# Patient Record
Sex: Female | Born: 1960 | Race: Black or African American | Hispanic: No | Marital: Married | Smoking: Former smoker
Health system: Southern US, Community
[De-identification: ages and names within clinical notes are randomized; demographics above are authoritative.]

## PROBLEM LIST (undated history)

## (undated) DIAGNOSIS — R51 Headache: Secondary | ICD-10-CM

## (undated) DIAGNOSIS — K5790 Diverticulosis of intestine, part unspecified, without perforation or abscess without bleeding: Secondary | ICD-10-CM

## (undated) DIAGNOSIS — K589 Irritable bowel syndrome without diarrhea: Secondary | ICD-10-CM

## (undated) DIAGNOSIS — E039 Hypothyroidism, unspecified: Secondary | ICD-10-CM

## (undated) DIAGNOSIS — R519 Headache, unspecified: Secondary | ICD-10-CM

## (undated) HISTORY — PX: THYROIDECTOMY: SHX17

## (undated) HISTORY — DX: Diverticulosis of intestine, part unspecified, without perforation or abscess without bleeding: K57.90

## (undated) HISTORY — PX: APPENDECTOMY: SHX54

## (undated) HISTORY — DX: Irritable bowel syndrome without diarrhea: K58.9

---

## 1989-01-29 HISTORY — PX: TUBAL LIGATION: SHX77

## 2003-09-15 ENCOUNTER — Other Ambulatory Visit: Admission: RE | Admit: 2003-09-15 | Discharge: 2003-09-15 | Payer: Self-pay | Admitting: Obstetrics and Gynecology

## 2003-12-20 ENCOUNTER — Encounter (INDEPENDENT_AMBULATORY_CARE_PROVIDER_SITE_OTHER): Payer: Self-pay | Admitting: Specialist

## 2003-12-21 ENCOUNTER — Inpatient Hospital Stay (HOSPITAL_COMMUNITY): Admission: RE | Admit: 2003-12-21 | Discharge: 2003-12-22 | Payer: Self-pay | Admitting: Obstetrics & Gynecology

## 2004-01-30 HISTORY — PX: ABDOMINAL HYSTERECTOMY: SUR658

## 2004-02-22 ENCOUNTER — Encounter (INDEPENDENT_AMBULATORY_CARE_PROVIDER_SITE_OTHER): Payer: Self-pay | Admitting: Specialist

## 2004-02-22 ENCOUNTER — Ambulatory Visit (HOSPITAL_COMMUNITY): Admission: RE | Admit: 2004-02-22 | Discharge: 2004-02-22 | Payer: Self-pay | Admitting: Obstetrics & Gynecology

## 2006-07-30 ENCOUNTER — Ambulatory Visit: Payer: Self-pay | Admitting: Gastroenterology

## 2006-07-30 LAB — CONVERTED CEMR LAB
Basophils Relative: 0.4 % (ref 0.0–1.0)
CRP, High Sensitivity: 1 (ref 0.00–5.00)
Eosinophils Relative: 1.4 % (ref 0.0–5.0)
Lymphocytes Relative: 32.2 % (ref 12.0–46.0)
Monocytes Relative: 5.7 % (ref 3.0–11.0)
Platelets: 287 10*3/uL (ref 150–400)
RBC: 4.09 M/uL (ref 3.87–5.11)
RDW: 12.2 % (ref 11.5–14.6)
Sed Rate: 15 mm/hr (ref 0–25)
Tissue Transglutaminase Ab, IgA: 0.4 units (ref <7)
WBC: 8.4 10*3/uL (ref 4.5–10.5)

## 2006-09-10 ENCOUNTER — Ambulatory Visit: Payer: Self-pay | Admitting: Gastroenterology

## 2006-09-10 ENCOUNTER — Encounter: Payer: Self-pay | Admitting: Gastroenterology

## 2006-09-10 DIAGNOSIS — K5730 Diverticulosis of large intestine without perforation or abscess without bleeding: Secondary | ICD-10-CM | POA: Insufficient documentation

## 2006-10-14 ENCOUNTER — Ambulatory Visit: Payer: Self-pay | Admitting: Gastroenterology

## 2006-10-28 ENCOUNTER — Ambulatory Visit: Payer: Self-pay | Admitting: Gastroenterology

## 2006-11-14 ENCOUNTER — Ambulatory Visit: Payer: Self-pay | Admitting: Gastroenterology

## 2006-11-22 ENCOUNTER — Ambulatory Visit: Payer: Self-pay | Admitting: Gastroenterology

## 2006-12-06 ENCOUNTER — Ambulatory Visit: Payer: Self-pay | Admitting: Gastroenterology

## 2007-01-27 ENCOUNTER — Ambulatory Visit: Payer: Self-pay | Admitting: Gastroenterology

## 2007-04-21 DIAGNOSIS — K589 Irritable bowel syndrome without diarrhea: Secondary | ICD-10-CM | POA: Insufficient documentation

## 2007-04-21 DIAGNOSIS — R197 Diarrhea, unspecified: Secondary | ICD-10-CM

## 2008-03-09 ENCOUNTER — Encounter: Payer: Self-pay | Admitting: Gastroenterology

## 2008-03-10 ENCOUNTER — Ambulatory Visit: Payer: Self-pay | Admitting: Gastroenterology

## 2008-03-17 ENCOUNTER — Telehealth: Payer: Self-pay | Admitting: Gastroenterology

## 2008-05-19 ENCOUNTER — Encounter: Payer: Self-pay | Admitting: Gastroenterology

## 2008-06-17 ENCOUNTER — Ambulatory Visit: Payer: Self-pay | Admitting: Gastroenterology

## 2008-06-17 ENCOUNTER — Telehealth: Payer: Self-pay | Admitting: Gastroenterology

## 2008-06-17 DIAGNOSIS — K219 Gastro-esophageal reflux disease without esophagitis: Secondary | ICD-10-CM

## 2010-06-13 NOTE — Assessment & Plan Note (Signed)
[facility]                         GASTROENTEROLOGY OFFICE NOTE   HAVEN, [name]                      MRN:          [id number]  DATE:07/30/2006                            DOB:          September 13, 1960    REFERRING PHYSICIAN:  Freddy Finner, M.D.   PROBLEM:  Chronic diarrhea.   HISTORY:  Monica Aguilar is a pleasant, 50 year old, African-American female  generally in good health. 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 She is status post hysterectomy in 2006 and had a  bilateral tubal ligation in 1991. She relates that she has had chronic  problems with diarrhea over at least the past 8-9 years and had been  told in the past that she had irritable bowel. She did have a barium  enema 5 or 6 years ago through [facility]. She says that she  feels that her symptoms have gotten somewhat worse with time. She is  taking Imodium or other over-the-counter antidiarrheals on a daily basis  and says she does not feel that she can leave the house without the  antidiarrheals. These do help her symptoms but she still has loose  stools and urgency. If she does not take the medication, she has very  urgent stools even to the point of having accidents. She says she gets  extreme urgency post prandially and generally is having 5 or 6 bowel  movements per day. She has not noticed any melena or hematochezia, she  occasionally gets some mild cramping and discomfort though not on a  daily basis and has had increased problems with gas and bloating as  well. She has no nocturnal symptoms. Her appetite has been fine, her  weight has been stable. She definitely feels that she is lactose  intolerant and avoids lactose and also feels that her symptoms are  triggered by spicy foods.   FAMILY HISTORY:  Negative for inflammatory bowel disease, celiac  disease, etc.   CURRENT MEDICATIONS:  1. Synthroid 0.137 mcg daily.  2. Allegra 180 daily.  3.  Dicyclomine 10 mg q.i.d. p.r.n.  4. Calcium supplement daily.  5. Multivitamin daily.  6. B complex daily.   ALLERGIES:  No known drug allergies.   PAST MEDICAL HISTORY:  As outlined above.   SOCIAL HISTORY:  The patient is married. She is employed as an Chemical engineer. She has 3 children, lives with her husband.  She is a smoker, drinks alcohol socially.   FAMILY HISTORY:  Negative for colon cancer, polyps or inflammatory bowel  disease. Father and niece both with diabetes.   REVIEW OF SYSTEMS:  Positive for allergy sinus symptoms, also low back  pain. GI as outlined above. Review of systems otherwise completely  negative.   PHYSICAL EXAMINATION:  GENERAL:  Well-developed, healthy-appearing,  African-American female in no acute distress. Alert and oriented x3.  VITAL SIGNS:  Blood pressure 122/82, pulse is 88.  HEENT:  Nontraumatic, normocephalic. EOMI. PERRLA. Sclera anicteric.  CARDIOVASCULAR:  Regular rate and rhythm with S1 and S2. No murmurs,  rubs or gallops.  PULMONARY:  Clear to A&P.  ABDOMEN:  Soft and nontender. There is no palpable mass or  hepatosplenomegaly. Bowel sounds are active.  RECTAL:  Not done.  EXTREMITIES:  Without clubbing, cyanosis or edema.   LABORATORY DATA:  Pending.   IMPRESSION:  A 50 year old, African-American female with chronic  diarrhea and lactose intolerance. Symptoms may all be secondary to  irritable bowel syndrome, diarrhea predominant. Rule out celiac disease,  rule out underlying inflammatory bowel disease or microscopic colitis.   PLAN:  1. Check CBC with DIF, sed rate, C reactive protein and sprue panel.  2. The patient had tried Bentyl in the past and could not tolerate due      to sleepiness. Will try Robinul Forte 2 mg b.i.d. and told her to      continue the antidiarrheal as needed. Hopefully she will require      less with the Robinul.  3. Trial of Align probiotic supplement 1 p.o. daily x30 days.  4.  Schedule colonoscopy with random biopsies. If workup is negative      will consider trial of Xifaxan and/or enrollment in an irritable      bowel syndrome clinical trial.      Mike Gip, PA-C  Electronically Signed      Barbette Hair. Arlyce Dice, MD,FACG  Electronically Signed   AE/MedQ  DD: 07/30/2006  DT: 07/31/2006  Job #: [id number]   cc:   Freddy Finner, M.D.

## 2010-06-13 NOTE — Assessment & Plan Note (Signed)
[facility]                         GASTROENTEROLOGY OFFICE NOTE   Monica Aguilar, Monica Aguilar                      MRN:          [id number]  DATE:10/14/2006                            DOB:          10/21/1960    PROBLEM:  Diarrhea.   HISTORY:  [name] has returned for scheduled GI followup.  Colonoscopy was pertinent for mild diverticulosis.  On Probiotica her  symptoms have improved, though she continues to have frequent loose  stools, accompanied by urgency.   PHYSICAL EXAMINATION:  VITAL SIGNS: Pulse 68, blood pressure 118/72.  Weight 169.   IMPRESSION:  Irritable bowel syndrome - diarrhea predominant.   RECOMMENDATIONS:  [name] will consider enrollment in an IBS trial.  Failing that , I would consider adding anticholinergics, while  continuing her Probiotic's.     Barbette Hair. Arlyce Dice, MD,FACG  Electronically Signed    RDK/MedQ  DD: 10/14/2006  DT: 10/14/2006  Job #: [id number]   cc:   Freddy Finner, M.D.

## 2010-06-16 NOTE — Discharge Summary (Signed)
Monica Aguilar NO.:  [id number]   MEDICAL RECORD NO.:  [id number]          PATIENT TYPE:  INP   LOCATION:  9316                          [facility]ACILITY:  [facility]   PHYSICIAN:  Freddy Finner, M.D.   DATE OF BIRTH:  12-30-1960   DATE OF ADMISSION:  12/20/2003  DATE OF DISCHARGE:  12/22/2003                                 DISCHARGE SUMMARY   DISCHARGE DIAGNOSES:  1.  Uterine leiomyomata.  2.  Uterine adenomyosis.  3.  Extensive pelvic adhesions.   OPERATIVE PROCEDURE:  1.  Laparoscopically-assisted vaginal hysterectomy.  2.  Lysis of extensive pelvic adhesion.  3.  Right salpingo-oophorectomy.  4.  Surgical absence of left tube and ovary.   INTRAOPERATIVE COMPLICATIONS:  None.   POSTOPERATIVE COMPLICATIONS:  Irritable bowel-like-syndrome/ileus  postoperatively, resolved.   OTHER INTRAOPERATIVE COMPLICATIONS:  Low-grade temperature elevation,  associated with ileus.   OTHER COMPLICATIONS:  None.   DISPOSITION:  The patient was in satisfactory improved condition at the time  of her discharge on the 2nd postoperative day.  Her maximum postoperative  temperature elevation was 100.7 but on the morning of discharge, she was  afebrile.  Her marked abdominal cramping discomfort had been relieved by  Bentyl 10 mg q.6h.  She was having adequate bowel and bladder function at  the time of her discharge on the 2nd postoperative morning.  She was  discharged at that time for follow up in the office in 2 weeks. She is to  take a regular diet.  She was given Bentyl to be taken every 6 hours as  needed for abdominal discomfort.  She was given Vivelle 0.1 dot skin patch  for transdermal estrogen replacement.  She was given Percocet 5/325 for  postoperative pain.  She was given instructions to use Mylicon and/or Gas-X  for abdominal distention and discomfort.   Details of the present illness, past history, family history, review of  systems, and physical exam are recorded  in the admission note.  The  admission physical findings were remarkable for enlargement of the uterus.   Laboratory data during this admission includes admission CBC which was  normal.  Admission prothrombin time and PTT were normal.  Postoperative  hemoglobin was 11.2.  Admission urinalysis was normal.   HOSPITAL COURSE:  The patient was admitted on the morning of surgery.  The  above-described operative procedure was accomplished without significant  intraoperative complications.  Postoperatively, her only complaint was of  moderate to severe abdominal cramping-like sensations which she attributed  to previous symptoms consistent with irritable bowel syndrome.  She did have  mild temperature elevation but maximum temp of 100.7  which dissipated by the morning of discharge.  She responded well to  restricted diet and oral Bentyl.  By the morning of her discharge, she was  tolerating a regular diet.  Her condition was considered to be satisfactory,  and she was discharged home with disposition as noted above.     Monica Aguilar   WRN/MEDQ  D:  01/20/2004  T:  01/21/2004  Job:  [id number]

## 2010-06-16 NOTE — Op Note (Signed)
NAMESHEILLA, MARIS NO.:  [id number]   MEDICAL RECORD NO.:  [id number]          PATIENT TYPE:  OBV   LOCATION:  9399                          [facility]ACILITY:  [facility]   PHYSICIAN:  Freddy Finner, M.D.   DATE OF BIRTH:  14-Jul-1960   DATE OF PROCEDURE:  12/20/2003  DATE OF DISCHARGE:                                 OPERATIVE REPORT   PREOPERATIVE DIAGNOSES:  Fibroids, chronic pelvic pain, severe dysmenorrhea.   POSTOPERATIVE DIAGNOSES:  Fibroids, chronic pelvic pain, severe dysmenorrhea  with surgical absence of left fallopian tube and ovary.  Extensive omental  adhesions intra-abdominally.   OPERATIVE PROCEDURE:  Laparoscopically-assisted vaginal hysterectomy, lysis  of right adnexal adhesions, lysis of omental adhesion, right salpingo-  oophorectomy.   SURGEON:  Dr. Jennette Kettle.   ASSISTANT:  Dr. Rosalia Hammers.   ESTIMATED INTRAOPERATIVE BLOOD LOSS:  100 cc.   INTRAOPERATIVE COMPLICATIONS:  None.   Details of the present illness are recorded in the admission note.  The  patient was admitted on the morning of surgery.  She received a bolus of  antibiotic IV preoperatively.  She was placed in PISO's.  She was brought to  the operating room, there placed under adequate general endotracheal  anesthesia, placed in the dorsal lithotomy position using the [place] stirrup  system.  Betadine prep was carried out in the usual fashion using Betadine  scrub followed by Betadine solution.  The bladder was evacuated with the  [facility] catheter.  The Hulka tenaculum was attached to the cervix without  difficulty.  Sterile drapes were applied.  Two small incisions were made,  one at the umbilicus and one just above the symphysis.  Through the  umbilical incision, a 9 mm non bladed disposable trocar was introduced while  elevating the anterior abdominal wall manually.  The rest of the inspection  revealed adequate placement without evidence of injury on entry.  The trocar  actually penetrated  through the omentum which was adherent to the anterior  abdominal wall but there was no injury associated with this and no bleeding  associated with this.  Extensive adhesions were noted both in the upper  abdomen and in the lower abdomen.  Photographs were made as well as  photographs of other findings which are retained in the office record.  The  uterus itself was visualized.  It was normal in size.  The right ovary was  adherent to the right lateral pelvic side wall but was otherwise normal as  was the fallopian tube.  The left ovary was surgically absent.  The uterus  was enlarged.  There were dense adhesions on the anterior lower uterine  segment.  Using the Gyrus tripolar device, one band-like omental adhesion  was lysed in the midline below the umbilicus.  The adhesions holding the  bladder into the lower segment were sealed and divided to release the  bladder.  The right adnexa was then elevated with a grasping forceps and the  infundibulopelvic ligament.  The adhesions holding the ovary to the right  pelvic side wall and the upper broad ligament and round ligament were all  sealed and divided.  This dissection was carried down to the level just  above the uterine artery.  On the left side, the upper broad ligament and  remnants of the round and infundibulopelvic ligaments were sealed and  divided to the level near the uterine artery.  Attention was then turned  vaginally.  The posterior weighted vaginal retractor was placed.  Beavers  were used to retract the anterior and lateral vaginal walls.  The cervix was  grasped with a Jacob's tenaculum and the Hulka tenaculum and removed.  A  colpotomy incision was made while tenting the mucosa posterior to the cervix  with an Alice.  The cervix was circumscribed by the scalpel to release the  mucosa.  The bladder was advanced off the cervix.  The Haney style Gyrus  clamp was then used to seal and divide the uterosacral pedicles and  bladder  pelvis.  The bladder was further advanced off the cervix and anterior  peritoneum entered.  The cardinal ligament pedicles were taken, sealed and  divided with the Gyrus device.  The uterine arteries were taken, sealed and  divided with the Gyrus device.  The uterus was then delivered through the  vaginal introitus.  The angles of the vagina were anchored to the  uterosacral ligaments with mattress sutures of 0 Monocryl.  The uterosacrals  were plicated and the posterior peritoneum closed with an interrupted 0  Monocryl suture.  The cuff was closed vertically with figure-of-eight of 0  Monocryl.  The Foley catheter was placed.  Re-inspection laparoscopically  was then carried out using the Nazat irrigation system.  No active bleedings  were noted on any of the dissected surfaces.  Irrigating solution was  aspirated from the abdomen.  The instruments were removed.  The incisions  were anesthetized with 0.5% plain Marcaine.  The incisions were closed with  interrupted subcuticular sutures of 3-0 Dexon.  Steri-Strips were applied to  the lower incision.  The patient was awakened and taken to the recovery room  in good condition.     Hosie Spangle   WRN/MEDQ  D:  12/20/2003  T:  12/20/2003  Job:  [id number]

## 2010-06-16 NOTE — H&P (Signed)
Monica Aguilar, GINDLESPERGER NO.:  [id number]   MEDICAL RECORD NO.:  [id number]          PATIENT TYPE:  AMB   LOCATION:  SDC                           [facility]ACILITY:  [facility]   PHYSICIAN:  Freddy Finner, M.D.   DATE OF BIRTH:  July 04, 1960   DATE OF ADMISSION:  DATE OF DISCHARGE:                                HISTORY & PHYSICAL   ADMISSION DIAGNOSES:  1.  Uterine fibroids.  2.  Chronic pelvic pain.  3.  Severe dysmenorrhea.   The patient is a 50 year old white married female, gravida 3, para 3, who  has a long history of intermittent episodes of pelvic pain, and has been  known to have uterine fibroids for a number of years.  Her pain she  described as intermittent in the past, but over the last approximately 5-6  months she has had significantly more frequent episodes of pelvic pain, she  has severe dysmenorrhea, she has a pressure sensation of her bladder.  A  pelvic ultrasound was performed in the office which showed a 4.3 x 3.5  centimeter intramural fibroid which abuts on the endometrial stripe.  There  are also a 1.6 centimeter and 9 millimeter smaller leiomyomata versus simple  right ovarian cyst measuring 16 millimeters most consistent with a follicle.  Pelvic findings were otherwise normal.  The patient does have a past history  of having her left ovary and fallopian tube removed for reasons that are  unclear.  She has also previously had an appendectomy.  Because of the  persistence of her symptoms, she has requested definitive surgical  intervention.  She is now admitted for laparoscopically assisted vaginal  hysterectomy and possible unilateral salpingo-oophorectomy.   Her current review of systems is otherwise negative.  She has no  cardiopulmonary, GI, or GU complaints.   PAST MEDICAL HISTORY:  The patient has had a total thyroidectomy.  She takes  Synthroid on a regular basis.  [facility].  Previous  surgical procedures include the surgery as  noted above.  She has no other known surgical procedures except giving birth  to children vaginally x3.  She has never had a blood transfusion.  Her only  chronic medication is Synthroid and she takes pain medication.  She does not  use cigarettes.   FAMILY HISTORY:  Noncontributory.   PHYSICAL EXAMINATION:  HEENT:  Grossly within normal limits.  Thyroid gland  is not palpable.  Blood pressure in the office 140/82.  CHEST:  Clear to auscultation.  HEART:  Normal sinus rhythm without murmurs, rubs or gallops.  BREASTS:  Exam is normal.  No masses.  No skin changes.  No nipple  discharge.  ABDOMEN:  Soft, moderately obese, there is no appreciable organomegaly, no  palpable masses.  PELVIC:  External genitalia, vagina and cervix are normal.  Bimanual reveals  the uterus to be enlarged.  There are no palpable adnexal masses.  The  rectum is palpably normal and the rectovaginal exam confirms the above  findings.  EXTREMITIES:  Without cyanosis, clubbing or edema.  ASSESSMENT:  1.  Uterine enlargement secondary to fibroids.  2.  Severe dysmenorrhea.  3.  Chronic pelvic pressure pain.   PLAN:  Laparoscopically assisted vaginal hysterectomy, possible unilateral  salpingo-oophorectomy.     Hosie Spangle   WRN/MEDQ  D:  12/17/2003  T:  12/17/2003  Job:  [id number]

## 2010-06-16 NOTE — Op Note (Signed)
Monica Aguilar, CRIGER NO.:  [id number]   MEDICAL RECORD NO.:  [id number]          PATIENT TYPE:  AMB   LOCATION:  SDC                           [facility]   PHYSICIAN:  Freddy Finner, M.D.   DATE OF BIRTH:  [birth year]   DATE OF PROCEDURE:  02/22/2004  DATE OF DISCHARGE:                                 OPERATIVE REPORT   PREOPERATIVE DIAGNOSIS:  Chronic persistent right pelvic and lower abdominal  pain, cystic right adnexal collection of fluid by ultrasound, known  significant pelvic adhesions, recent laparoscopically-assisted vaginal  hysterectomy, right salpingo-oophorectomy in November 2005, with  intraoperative diagnosis of pelvic adhesions, histologic diagnosis of  uterine adenomyosis and uterine leiomyomata.   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   OPERATIVE PROCEDURE:  Laparoscopy, lysis of pelvic adhesions, resection of  scarred or ovarian remnant from right pelvic sidewall, resection of left  ovarian remnant from ileum, lysis of pelvic adhesions, with the release of  ileum from pelvic peritoneum.   SURGEON:  Freddy Finner, M.D.   ANESTHESIA:  General.   ESTIMATED INTRAOPERATIVE BLOOD LOSS:  10 cc.   INTRAOPERATIVE COMPLICATIONS:  None.   ANESTHESIA:  General endotracheal.   The patient is a 50 year old who had surgery as described above on December 20, 2003.  Her postoperative course was remarkable for a postoperative ileus  and low-grade temperature.  She ultimately was discharged from the hospital  without further complications and had an essentially uneventful  postoperative course except for the persistence of right-sided pelvic pain  and dyspareunia.  Pelvic ultrasound was obtained on February 17, 2004, with  the finding of a 5.4 x 2 x 2.4 cm collection of fluid which appeared to be  simple.  Due to the persistence of her symptoms, the presumed recurrence of  pelvic adhesions, the  patient has requested surgical intervention and is  admitted at this time for laparoscopy.   Laparoscopic findings are recorded in still photographs which are retained  in the office record.  There was no definite fluid collection noted at  surgery, but there was what appeared to be a small ovarian remnant on the  right pelvic sidewall at the level of the infundibulopelvic ligament.  There  were adhesions of rectosigmoid, sigmoid colon, and ileum, and the left  adnexa.  On careful exploration of this area, what appeared to be an ovarian  remnant was noted located on the loop of ileum which was adherent to the  left pelvic sidewall and at the level of the reflection of the peritoneum  with the bladder on the left side.   The patient was admitted on the morning of surgery.  She was brought to the  operating room, and there placed under adequate general anesthesia, placed  in the dorsal lithotomy position, using the [employer] system.  Betadine  prep of abdomen, perineum, and urethral meatus was carefully carried out.  Bladder was evacuated with a Robinson catheter.  Sponge forceps with two  sponges at the tip was placed into the vaginal canal for identification  during the  procedure.  Sterile drapes were applied.  Two small incisions  were made, one at the umbilicus through an old scar, and one just above the  symphysis through an old scar.  An 11 mm nonbladed disposable trocar was  introduced at the umbilicus while elevating the anterior abdominal wall  manually.  Direct inspection revealed adequate placement, with no evidence  of injury on entry.  Pneumoperitoneum was allowed to accumulate with carbon  dioxide gas.  A 5 mm trocar was placed through the lower incision under  direct visualization.  Through it, different instruments were used during  the procedure, including a blunt probe, a spring-loaded grasping forceps,  and a [place] irrigation/aspiration system.  Initial attention was  turned to  the remnant on the right pelvic sidewall.  The gyrus tripolar fulguration  and dissection tool was used on the right pelvic sidewall.  The remnant was  grasped with the spring-loaded forceps, and traction pulled it away from the  pelvic sidewall.  Using the gyrus device, the mass was excised and removed  in segments and submitted as a histologic sample.  Attention was directed to  the left side, where adhesions were noted holding the ileum into the pelvis  along the pelvic sidewall.  There were also adhesions of the sigmoid colon  along the pelvic sidewall.  The sigmoid adhesions were partially lysed using  a blunt probe.  Because of potential traction causing pain with the ileum,  it was elected to resect this adhesion.  On the inferior side of this area  was an approximately 1 cm white structure consistent with an ovarian remnant  which had not been identified at her previous surgery.  Using reticulated  Endoshears with unipolar cautery, the adhesions were carefully dissected,  and the ovarian remnant was dissected off of the bowel.  This was submitted  as a separate sample.  Having completely released the ileal adhesion, it was  elected not to dissect the rectosigmoid further out of concern for trauma or  bleeding.  This was in a relatively normal anatomical configuration.  The  [place] system was then used to carefully irrigate and examine all surfaces  in the pelvis and along the bowel, with the finding of complete hemostasis.  Irrigating solution was aspirated from the abdomen.  All instruments were  removed.  The skin incisions were anesthetized with 0.5% plain Marcaine.  The incisions were closed with interrupted subcuticular sutures of 3-0  Dexon, and Steri-Strips were applied to the lower incision.  The patient was  awakened and taken to the recovery room in good condition.      WRN/MEDQ  D:  02/22/2004  T:  02/22/2004  Job:  [id number]

## 2011-05-01 ENCOUNTER — Telehealth: Payer: Self-pay | Admitting: Gastroenterology

## 2011-05-01 NOTE — Telephone Encounter (Signed)
Spoke with patient and gave her office policy for switching MD's. She does not wish to pursue this.

## 2011-05-08 ENCOUNTER — Telehealth: Payer: Self-pay | Admitting: Gastroenterology

## 2011-05-08 NOTE — Telephone Encounter (Signed)
Pt states she continues to have diarrhea, immodium is not helping. Pt requesting to be seen sooner. Pt scheduled to see Willette Cluster NP tomorrow at 8:30am. Pt aware of appt date and time.

## 2011-05-09 ENCOUNTER — Ambulatory Visit (INDEPENDENT_AMBULATORY_CARE_PROVIDER_SITE_OTHER): Payer: Managed Care, Other (non HMO) | Admitting: Nurse Practitioner

## 2011-05-09 ENCOUNTER — Encounter: Payer: Self-pay | Admitting: Nurse Practitioner

## 2011-05-09 VITALS — BP 120/76 | HR 80 | Ht 62.0 in | Wt 183.8 lb

## 2011-05-09 DIAGNOSIS — K589 Irritable bowel syndrome without diarrhea: Secondary | ICD-10-CM

## 2011-05-09 MED ORDER — DIPHENOXYLATE-ATROPINE 2.5-0.025 MG PO TABS: 1.0000 | ORAL_TABLET | Freq: Three times a day (TID) | ORAL | Status: AC

## 2011-05-09 MED ORDER — METRONIDAZOLE 250 MG PO TABS: 250.0000 mg | ORAL_TABLET | Freq: Three times a day (TID) | ORAL | Status: AC

## 2011-05-09 NOTE — Patient Instructions (Signed)
We sent prescriptions to [facility] [place]. Make a follow up appointment with Dr. Arlyce Dice 3-4 weeks out from today.

## 2011-05-09 NOTE — Progress Notes (Signed)
Monica Aguilar [id number] Oct 09, 1960   HISTORY OR PRESENT ILLNESS : Patient is a 51 year old female known to Dr. Arlyce Dice for a history of irritable bowel syndrome. Her last colonoscopy was August 2008 done for evaluation of diarrhea. Exam was normal except for diverticulosis. Random biopsies were negative. At her last in May 2010 she was given a trial of Xifaxan three times daily for ten days with instructions to then take five days out of every month. She tried Nurse, adult, believes it helped but was too expensive. Patient called the office yesterday with complaints of persistent diarrhea, Imodium doesn't help like it used to. She has excessive gas, belching, stomach grumbing. Loose stools are mostly postprandial and are often associated with cramps and urgency. She does feel better after defecation. No nocturnal diarrhea.   Patient is scared to go anywhere, diarrhea is controlling her life at this point. No recent antibiotics  Current Medications, Allergies, Past Medical History, Past Surgical History, Family History and Social History were reviewed in [employer] record.   PHYSICAL EXAMINATION : General:  Well developed black female in no acute distress Head: Normocephalic and atraumatic Eyes:  sclerae anicteric,conjunctive pink. Ears: Normal auditory acuity Neck: Supple, no masses.  Lungs: Clear throughout to auscultation Heart: Regular rate and rhythm Abdomen: Soft, nondistended, nontender. No masses or hepatomegaly noted. Normal bowel sounds Rectal: not done Musculoskeletal: Symmetrical with no gross deformities  Skin: No lesions on visible extremities Extremities: No edema or deformities noted Neurological: Oriented , grossly nonfocal Cervical Nodes:  No significant cervical adenopathy Psychological:  Alert and cooperative. 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 She's taking three Imodium a day. Patient was unable to afford Xifaxan. Will try her on a course of metronidazole.She can try Lomotil as well but have asked her to first see how course of Flagyl works (to guide future treatment). If diarrhea persists despite these measures, she may be a candidate for Lotronex. Followup with Dr. Arlyce Dice in 3-4 weeks.

## 2011-05-10 NOTE — Progress Notes (Signed)
I agree with the plan outlined in this note 

## 2011-05-14 ENCOUNTER — Telehealth: Payer: Self-pay | Admitting: Nurse Practitioner

## 2011-05-14 NOTE — Telephone Encounter (Signed)
Left a message for patient to call me. 

## 2011-05-14 NOTE — Telephone Encounter (Signed)
Please refer this to Janalee Dane and GI research. She may be a candidate for a research study.

## 2011-05-14 NOTE — Telephone Encounter (Signed)
Lomotil 1-2 tabs q6h prn

## 2011-05-14 NOTE — Telephone Encounter (Signed)
Begin lotronex 0.5mg  bid.  She needs to see the warnings about lotronex. I'd like to see her in one month.

## 2011-05-14 NOTE — Telephone Encounter (Signed)
Spoke with patient and she states she saw Willette Cluster, NP on 05/09/11 and was given Flagyl to try for the diarrhea she was having. Gunnar Fusi also gave her a Lomotil rx but told her not to take it until she saw how the Flagyl worked. Per patient, the Flagyl has not helped the diarrhea. She states she is still having diarrhea usually after eating. States she has to "run to the bathroom" due to urgency. She is also having lots of gas. Hx IBS Please, advise.

## 2011-05-14 NOTE — Telephone Encounter (Signed)
Spoke with patient and she would like to try Lomotil first then if this does not work she might try Lotronex. She states she had thyroid levels that kept her out of the research last time she tried to do a research.

## 2011-05-14 NOTE — Telephone Encounter (Signed)
Yes, Lotronex was what I thought she may ultimately try if everything else failed. Will see how see does with Lomotil first. Thanks

## 2011-05-14 NOTE — Telephone Encounter (Signed)
Spoke with patient and she does not wish to start on study at this time because she has a cruise in early May. She states she was told by Willette Cluster, NP that there are other medications she might try. Please, advise.

## 2011-05-14 NOTE — Telephone Encounter (Signed)
Patient given recommendations per Dr. Arlyce Dice.

## 2011-06-13 ENCOUNTER — Ambulatory Visit: Payer: Self-pay | Admitting: Gastroenterology

## 2013-05-21 ENCOUNTER — Ambulatory Visit: Payer: Self-pay

## 2013-05-21 ENCOUNTER — Other Ambulatory Visit: Payer: Self-pay | Admitting: Occupational Medicine

## 2013-05-21 DIAGNOSIS — M25532 Pain in left wrist: Secondary | ICD-10-CM

## 2014-08-19 ENCOUNTER — Other Ambulatory Visit: Payer: Self-pay | Admitting: Obstetrics & Gynecology

## 2014-08-20 LAB — CYTOLOGY - PAP

## 2015-01-25 ENCOUNTER — Other Ambulatory Visit: Payer: Self-pay | Admitting: Sports Medicine

## 2015-01-25 DIAGNOSIS — M5030 Other cervical disc degeneration, unspecified cervical region: Secondary | ICD-10-CM

## 2015-02-05 ENCOUNTER — Other Ambulatory Visit: Payer: Self-pay

## 2015-02-12 ENCOUNTER — Inpatient Hospital Stay
Admission: RE | Admit: 2015-02-12 | Discharge: 2015-02-12 | Disposition: A | Discharge status: Home / Self Care | Payer: Self-pay | Source: Ambulatory Visit | Attending: Sports Medicine | Admitting: Sports Medicine

## 2015-03-14 ENCOUNTER — Other Ambulatory Visit (HOSPITAL_COMMUNITY): Payer: Self-pay | Admitting: Neurological Surgery

## 2015-03-18 ENCOUNTER — Ambulatory Visit (HOSPITAL_COMMUNITY)
Admission: RE | Admit: 2015-03-18 | Discharge: 2015-03-18 | Disposition: A | Discharge status: Home / Self Care | Payer: Managed Care, Other (non HMO) | Source: Ambulatory Visit | Attending: Neurological Surgery | Admitting: Neurological Surgery

## 2015-03-18 ENCOUNTER — Encounter (HOSPITAL_COMMUNITY): Payer: Self-pay

## 2015-03-18 ENCOUNTER — Encounter (HOSPITAL_COMMUNITY)
Admission: RE | Admit: 2015-03-18 | Discharge: 2015-03-18 | Disposition: A | Discharge status: Home / Self Care | Payer: Managed Care, Other (non HMO) | Source: Ambulatory Visit | Attending: Neurological Surgery | Admitting: Neurological Surgery

## 2015-03-18 DIAGNOSIS — M4802 Spinal stenosis, cervical region: Secondary | ICD-10-CM | POA: Insufficient documentation

## 2015-03-18 DIAGNOSIS — Z01818 Encounter for other preprocedural examination: Secondary | ICD-10-CM | POA: Insufficient documentation

## 2015-03-18 DIAGNOSIS — Z01810 Encounter for preprocedural cardiovascular examination: Secondary | ICD-10-CM | POA: Insufficient documentation

## 2015-03-18 DIAGNOSIS — Z01812 Encounter for preprocedural laboratory examination: Secondary | ICD-10-CM | POA: Diagnosis not present

## 2015-03-18 HISTORY — DX: Headache: R51

## 2015-03-18 HISTORY — DX: Hypothyroidism, unspecified: E03.9

## 2015-03-18 HISTORY — DX: Headache, unspecified: R51.9

## 2015-03-18 LAB — PROTIME-INR
INR: 1.02 (ref 0.00–1.49)
Prothrombin Time: 13.6 seconds (ref 11.6–15.2)

## 2015-03-18 LAB — CBC WITH DIFFERENTIAL/PLATELET
Basophils Absolute: 0.1 10*3/uL (ref 0.0–0.1)
Basophils Relative: 1 %
EOS PCT: 1 %
Eosinophils Absolute: 0.1 10*3/uL (ref 0.0–0.7)
HEMATOCRIT: 37.8 % (ref 36.0–46.0)
Hemoglobin: 12.7 g/dL (ref 12.0–15.0)
LYMPHS ABS: 2.8 10*3/uL (ref 0.7–4.0)
LYMPHS PCT: 38 %
MCH: 31.4 pg (ref 26.0–34.0)
MCHC: 33.6 g/dL (ref 30.0–36.0)
MCV: 93.3 fL (ref 78.0–100.0)
Monocytes Absolute: 0.5 10*3/uL (ref 0.1–1.0)
Monocytes Relative: 7 %
NEUTROS ABS: 4 10*3/uL (ref 1.7–7.7)
Neutrophils Relative %: 53 %
PLATELETS: 291 10*3/uL (ref 150–400)
RBC: 4.05 MIL/uL (ref 3.87–5.11)
RDW: 12.5 % (ref 11.5–15.5)
WBC: 7.5 10*3/uL (ref 4.0–10.5)

## 2015-03-18 LAB — BASIC METABOLIC PANEL
ANION GAP: 10 (ref 5–15)
BUN: 13 mg/dL (ref 6–20)
CHLORIDE: 105 mmol/L (ref 101–111)
CO2: 24 mmol/L (ref 22–32)
Calcium: 9.1 mg/dL (ref 8.9–10.3)
Creatinine, Ser: 0.77 mg/dL (ref 0.44–1.00)
GFR calc non Af Amer: >60 mL/min (ref >60)
GLUCOSE: 103 mg/dL — AB (ref 65–99)
POTASSIUM: 3.7 mmol/L (ref 3.5–5.1)
SODIUM: 139 mmol/L (ref 135–145)

## 2015-03-18 LAB — SURGICAL PCR SCREEN
MRSA, PCR: NEGATIVE
STAPHYLOCOCCUS AUREUS: NEGATIVE

## 2015-03-18 NOTE — Pre-Procedure Instructions (Signed)
    Melodye Ped  03/18/2015      [facility] - [place], [place] - [address] [place] [address] Phone: [phone] Fax: [phone]  [facility] - [place], [place] - [address] [place] [address] Phone: [phone] Fax: [phone]    Your procedure is scheduled on Thursday, March 1.  Report to [facility] Admitting at 7:35 A.M.                 .Your surgery or procedure is scheduled for 9:35 AM   Call this number if you have problems the morning of surgery:[phone]                 For any other questions, please call [phone], Monday - Friday 8 AM - 4 PM.   Remember:  Do not eat food or drink liquids after midnight Wednesday, March 1.  Take these medicines the morning of surgery with A SIP OF WATER :estradiol (ESTRACE), levothyroxine (SYNTHROID).                Take if needed: dicyclomine (BENTYL), diphenoxylate-atropine (LOMOTIL),Eluxadoline (VIBERZI) .  May use Flonase.                     Stop taking VItamins and Herbal medications.    Do not wear jewelry, make-up or nail polish.  Do not wear lotions, powders, or perfumes.   Do not shave 48 hours prior to surgery.    Do not bring valuables to the hospital.  [facility] is not responsible for any belongings or valuables.  Contacts, dentures or bridgework may not be worn into surgery.  Leave your suitcase in the car.  After surgery it may be brought to your room.  For patients admitted to the hospital, discharge time will be determined by your treatment team.  Patients discharged the day of surgery will not be allowed to drive home.   Name and phone number of your driver:   -  Special instructions:  Review  [facility] - Preparing For Surgery.  Please read over the following fact sheets that you were given. Pain Booklet, Coughing and Deep Breathing, MRSA Information and Surgical Site Infection  Prevention

## 2015-03-18 NOTE — Pre-Procedure Instructions (Signed)
    Melodye Ped  03/18/2015      [facility] - [place], [place] - [address] [place] [address] Phone: [phone] Fax: [phone]  [facility] - [place], [place] - [address] [place] [address] Phone: [phone] Fax: [phone]    Your procedure is scheduled on Thursday, March 2.  Report to [facility] Admitting at 7:35 A.M.                 .Your surgery or procedure is scheduled for 9:35 AM   Call this number if you have problems the morning of surgery:[phone]                 For any other questions, please call [phone], Monday - Friday 8 AM - 4 PM.   Remember:  Do not eat food or drink liquids after midnight Wednesday, March 1.  Take these medicines the morning of surgery with A SIP OF WATER :estradiol (ESTRACE), levothyroxine (SYNTHROID).                Take if needed: dicyclomine (BENTYL), diphenoxylate-atropine (LOMOTIL),Eluxadoline (VIBERZI) .  May use Flonase.                     Stop taking VItamins and Herbal medications.    Do not wear jewelry, make-up or nail polish.  Do not wear lotions, powders, or perfumes.   Do not shave 48 hours prior to surgery.    Do not bring valuables to the hospital.  [facility] is not responsible for any belongings or valuables.  Contacts, dentures or bridgework may not be worn into surgery.  Leave your suitcase in the car.  After surgery it may be brought to your room.  For patients admitted to the hospital, discharge time will be determined by your treatment team.  Patients discharged the day of surgery will not be allowed to drive home.   Name and phone number of your driver:   -  Special instructions:  Review  [place] - Preparing For Surgery.  Please read over the following fact sheets that you were given. Pain Booklet, Coughing and Deep Breathing, MRSA Information and Surgical Site Infection  Prevention

## 2015-03-30 MED ORDER — DEXAMETHASONE SODIUM PHOSPHATE 10 MG/ML IJ SOLN
10.0000 mg | INTRAMUSCULAR | Status: AC
  Administered 2015-03-31: 10 mg via INTRAVENOUS
  Filled 2015-03-30: qty 1

## 2015-03-30 MED ORDER — CEFAZOLIN SODIUM-DEXTROSE 2-3 GM-% IV SOLR
2.0000 g | INTRAVENOUS | Status: AC
  Administered 2015-03-31: 2 g via INTRAVENOUS
  Filled 2015-03-30: qty 50

## 2015-03-31 ENCOUNTER — Encounter (HOSPITAL_COMMUNITY): Payer: Self-pay | Admitting: *Deleted

## 2015-03-31 ENCOUNTER — Ambulatory Visit (HOSPITAL_COMMUNITY): Payer: Managed Care, Other (non HMO)

## 2015-03-31 ENCOUNTER — Encounter (HOSPITAL_COMMUNITY)
Admission: RE | Disposition: A | Discharge status: Home / Self Care | Payer: Self-pay | Source: Ambulatory Visit | Attending: Neurological Surgery

## 2015-03-31 ENCOUNTER — Ambulatory Visit (HOSPITAL_COMMUNITY)
Admission: RE | Admit: 2015-03-31 | Discharge: 2015-04-01 | Disposition: A | Discharge status: Home / Self Care | Payer: Managed Care, Other (non HMO) | Source: Ambulatory Visit | Attending: Neurological Surgery | Admitting: Neurological Surgery

## 2015-03-31 ENCOUNTER — Ambulatory Visit (HOSPITAL_COMMUNITY): Payer: Managed Care, Other (non HMO) | Admitting: Anesthesiology

## 2015-03-31 DIAGNOSIS — M4802 Spinal stenosis, cervical region: Secondary | ICD-10-CM | POA: Diagnosis not present

## 2015-03-31 DIAGNOSIS — E039 Hypothyroidism, unspecified: Secondary | ICD-10-CM | POA: Diagnosis not present

## 2015-03-31 DIAGNOSIS — Z87891 Personal history of nicotine dependence: Secondary | ICD-10-CM | POA: Insufficient documentation

## 2015-03-31 DIAGNOSIS — Z981 Arthrodesis status: Secondary | ICD-10-CM

## 2015-03-31 DIAGNOSIS — M50221 Other cervical disc displacement at C4-C5 level: Secondary | ICD-10-CM | POA: Insufficient documentation

## 2015-03-31 DIAGNOSIS — Z419 Encounter for procedure for purposes other than remedying health state, unspecified: Secondary | ICD-10-CM

## 2015-03-31 HISTORY — PX: ANTERIOR CERVICAL DECOMP/DISCECTOMY FUSION: SHX1161

## 2015-03-31 SURGERY — ANTERIOR CERVICAL DECOMPRESSION/DISCECTOMY FUSION 1 LEVEL
Anesthesia: General | Site: Spine Cervical

## 2015-03-31 MED ORDER — METHOCARBAMOL 1000 MG/10ML IJ SOLN
500.0000 mg | Freq: Four times a day (QID) | INTRAVENOUS | Status: DC | PRN
  Filled 2015-03-31: qty 5

## 2015-03-31 MED ORDER — NEOSTIGMINE METHYLSULFATE 10 MG/10ML IV SOLN
INTRAVENOUS | Status: AC
  Filled 2015-03-31: qty 1

## 2015-03-31 MED ORDER — POTASSIUM CHLORIDE IN NACL 20-0.9 MEQ/L-% IV SOLN
INTRAVENOUS | Status: DC
  Filled 2015-03-31 (×3): qty 1000

## 2015-03-31 MED ORDER — GLYCOPYRROLATE 0.2 MG/ML IJ SOLN
INTRAMUSCULAR | Status: AC
  Filled 2015-03-31: qty 3

## 2015-03-31 MED ORDER — ARTIFICIAL TEARS OP OINT
TOPICAL_OINTMENT | OPHTHALMIC | Status: AC
  Filled 2015-03-31: qty 3.5

## 2015-03-31 MED ORDER — SODIUM CHLORIDE 0.9 % IJ SOLN
INTRAMUSCULAR | Status: AC
  Filled 2015-03-31: qty 10

## 2015-03-31 MED ORDER — MIDAZOLAM HCL 5 MG/5ML IJ SOLN
INTRAMUSCULAR | Status: DC | PRN
  Administered 2015-03-31: 2 mg via INTRAVENOUS

## 2015-03-31 MED ORDER — PROMETHAZINE HCL 25 MG/ML IJ SOLN: 6.2500 mg | INTRAMUSCULAR | Status: DC | PRN

## 2015-03-31 MED ORDER — SODIUM CHLORIDE 0.9 % IR SOLN
Status: DC | PRN
  Administered 2015-03-31: 10:00:00

## 2015-03-31 MED ORDER — ACETAMINOPHEN 650 MG RE SUPP
650.0000 mg | RECTAL | Status: DC | PRN
Start: 2015-03-31 — End: 2015-04-01

## 2015-03-31 MED ORDER — HYDROCODONE-ACETAMINOPHEN 7.5-325 MG PO TABS: 1.0000 | ORAL_TABLET | Freq: Once | ORAL | Status: DC | PRN

## 2015-03-31 MED ORDER — THROMBIN 5000 UNITS EX SOLR
CUTANEOUS | Status: DC | PRN
  Administered 2015-03-31 (×2): 5000 [IU] via TOPICAL

## 2015-03-31 MED ORDER — ROCURONIUM BROMIDE 50 MG/5ML IV SOLN
INTRAVENOUS | Status: AC
  Filled 2015-03-31: qty 1

## 2015-03-31 MED ORDER — MIDAZOLAM HCL 2 MG/2ML IJ SOLN
INTRAMUSCULAR | Status: AC
  Filled 2015-03-31: qty 2

## 2015-03-31 MED ORDER — SUGAMMADEX SODIUM 200 MG/2ML IV SOLN
INTRAVENOUS | Status: AC
  Filled 2015-03-31: qty 2

## 2015-03-31 MED ORDER — ESTRADIOL 1 MG PO TABS
1.0000 mg | ORAL_TABLET | Freq: Every day | ORAL | Status: DC
  Filled 2015-03-31 (×2): qty 1

## 2015-03-31 MED ORDER — PHENOL 1.4 % MT LIQD
1.0000 | OROMUCOSAL | Status: DC | PRN
  Administered 2015-03-31: 1 via OROMUCOSAL
  Filled 2015-03-31: qty 177

## 2015-03-31 MED ORDER — HEMOSTATIC AGENTS (NO CHARGE) OPTIME
TOPICAL | Status: DC | PRN
  Administered 2015-03-31: 1 via TOPICAL

## 2015-03-31 MED ORDER — ONDANSETRON HCL 4 MG/2ML IJ SOLN
INTRAMUSCULAR | Status: AC
  Filled 2015-03-31: qty 2

## 2015-03-31 MED ORDER — PHENYLEPHRINE 40 MCG/ML (10ML) SYRINGE FOR IV PUSH (FOR BLOOD PRESSURE SUPPORT)
PREFILLED_SYRINGE | INTRAVENOUS | Status: AC
  Filled 2015-03-31: qty 10

## 2015-03-31 MED ORDER — HYDROMORPHONE HCL 1 MG/ML IJ SOLN: 0.5000 mg | INTRAMUSCULAR | Status: DC | PRN

## 2015-03-31 MED ORDER — SUCCINYLCHOLINE CHLORIDE 20 MG/ML IJ SOLN
INTRAMUSCULAR | Status: AC
  Filled 2015-03-31: qty 1

## 2015-03-31 MED ORDER — PROPOFOL 10 MG/ML IV BOLUS
INTRAVENOUS | Status: DC | PRN
Start: 2015-03-31 — End: 2015-03-31
  Administered 2015-03-31: 150 mg via INTRAVENOUS

## 2015-03-31 MED ORDER — SUGAMMADEX SODIUM 200 MG/2ML IV SOLN
INTRAVENOUS | Status: DC | PRN
  Administered 2015-03-31: 160 mg via INTRAVENOUS

## 2015-03-31 MED ORDER — LEVOTHYROXINE SODIUM 88 MCG PO TABS
88.0000 ug | ORAL_TABLET | Freq: Every day | ORAL | Status: DC
  Administered 2015-04-01: 88 ug via ORAL
  Filled 2015-03-31: qty 1

## 2015-03-31 MED ORDER — SODIUM CHLORIDE 0.9% FLUSH
3.0000 mL | Freq: Two times a day (BID) | INTRAVENOUS | Status: DC
  Administered 2015-03-31 (×2): 3 mL via INTRAVENOUS

## 2015-03-31 MED ORDER — ONDANSETRON HCL 4 MG/2ML IJ SOLN
INTRAMUSCULAR | Status: DC | PRN
  Administered 2015-03-31: 4 mg via INTRAVENOUS

## 2015-03-31 MED ORDER — LIDOCAINE HCL (CARDIAC) 20 MG/ML IV SOLN
INTRAVENOUS | Status: AC
  Filled 2015-03-31: qty 5

## 2015-03-31 MED ORDER — 0.9 % SODIUM CHLORIDE (POUR BTL) OPTIME
TOPICAL | Status: DC | PRN
  Administered 2015-03-31: 1000 mL

## 2015-03-31 MED ORDER — ACETAMINOPHEN 325 MG PO TABS: 650.0000 mg | ORAL_TABLET | ORAL | Status: DC | PRN

## 2015-03-31 MED ORDER — METHOCARBAMOL 500 MG PO TABS
500.0000 mg | ORAL_TABLET | Freq: Four times a day (QID) | ORAL | Status: DC | PRN
  Administered 2015-03-31 – 2015-04-01 (×2): 500 mg via ORAL
  Filled 2015-03-31 (×2): qty 1

## 2015-03-31 MED ORDER — ONDANSETRON HCL 4 MG/2ML IJ SOLN: 4.0000 mg | INTRAMUSCULAR | Status: DC | PRN

## 2015-03-31 MED ORDER — FENTANYL CITRATE (PF) 100 MCG/2ML IJ SOLN
INTRAMUSCULAR | Status: DC | PRN
  Administered 2015-03-31: 25 ug via INTRAVENOUS
  Administered 2015-03-31 (×2): 50 ug via INTRAVENOUS
  Administered 2015-03-31: 100 ug via INTRAVENOUS
  Administered 2015-03-31: 25 ug via INTRAVENOUS

## 2015-03-31 MED ORDER — PROPOFOL 10 MG/ML IV BOLUS
INTRAVENOUS | Status: AC
  Filled 2015-03-31: qty 20

## 2015-03-31 MED ORDER — CEFAZOLIN SODIUM 1-5 GM-% IV SOLN
1.0000 g | Freq: Three times a day (TID) | INTRAVENOUS | Status: AC
Start: 2015-03-31 — End: 2015-04-01
  Administered 2015-03-31 – 2015-04-01 (×2): 1 g via INTRAVENOUS
  Filled 2015-03-31 (×2): qty 50

## 2015-03-31 MED ORDER — FENTANYL CITRATE (PF) 250 MCG/5ML IJ SOLN
INTRAMUSCULAR | Status: AC
  Filled 2015-03-31: qty 5

## 2015-03-31 MED ORDER — MENTHOL 3 MG MT LOZG: 1.0000 | LOZENGE | OROMUCOSAL | Status: DC | PRN

## 2015-03-31 MED ORDER — HYDROMORPHONE HCL 1 MG/ML IJ SOLN
INTRAMUSCULAR | Status: AC
  Filled 2015-03-31: qty 1

## 2015-03-31 MED ORDER — EPHEDRINE SULFATE 50 MG/ML IJ SOLN
INTRAMUSCULAR | Status: AC
  Filled 2015-03-31: qty 1

## 2015-03-31 MED ORDER — LIDOCAINE HCL (CARDIAC) 20 MG/ML IV SOLN
INTRAVENOUS | Status: DC | PRN
  Administered 2015-03-31: 60 mg via INTRAVENOUS

## 2015-03-31 MED ORDER — ROCURONIUM BROMIDE 100 MG/10ML IV SOLN
INTRAVENOUS | Status: DC | PRN
  Administered 2015-03-31: 40 mg via INTRAVENOUS

## 2015-03-31 MED ORDER — LACTATED RINGERS IV SOLN
INTRAVENOUS | Status: DC | PRN
  Administered 2015-03-31: 10:00:00 via INTRAVENOUS

## 2015-03-31 MED ORDER — LIDOCAINE HCL 4 % EX SOLN
CUTANEOUS | Status: DC | PRN
  Administered 2015-03-31: 4 mL via TOPICAL

## 2015-03-31 MED ORDER — SODIUM CHLORIDE 0.9% FLUSH: 3.0000 mL | INTRAVENOUS | Status: DC | PRN

## 2015-03-31 MED ORDER — HYDROMORPHONE HCL 1 MG/ML IJ SOLN
0.2500 mg | INTRAMUSCULAR | Status: DC | PRN
  Administered 2015-03-31 (×2): 0.5 mg via INTRAVENOUS

## 2015-03-31 MED ORDER — BUPIVACAINE HCL (PF) 0.25 % IJ SOLN
INTRAMUSCULAR | Status: DC | PRN
  Administered 2015-03-31: 5.5 mL

## 2015-03-31 MED ORDER — OXYCODONE-ACETAMINOPHEN 5-325 MG PO TABS
1.0000 | ORAL_TABLET | ORAL | Status: DC | PRN
  Administered 2015-03-31 – 2015-04-01 (×4): 2 via ORAL
  Filled 2015-03-31 (×4): qty 2

## 2015-03-31 MED ORDER — THROMBIN 5000 UNITS EX SOLR
OROMUCOSAL | Status: DC | PRN
  Administered 2015-03-31: 10:00:00 via TOPICAL

## 2015-03-31 SURGICAL SUPPLY — 50 items
APL SKNCLS STERI-STRIP NONHPOA (GAUZE/BANDAGES/DRESSINGS) ×1
BAG DECANTER FOR FLEXI CONT (MISCELLANEOUS) ×2 IMPLANT
BENZOIN TINCTURE PRP APPL 2/3 (GAUZE/BANDAGES/DRESSINGS) ×2 IMPLANT
BIT DRILL POWER (BIT) IMPLANT
BUR MATCHSTICK NEURO 3.0 LAGG (BURR) ×2 IMPLANT
CAGE COROENT 9X13X15 (Cage) ×2 IMPLANT
CANISTER SUCT 3000ML PPV (MISCELLANEOUS) ×2 IMPLANT
DRAPE C-ARM 42X72 X-RAY (DRAPES) ×4 IMPLANT
DRAPE LAPAROTOMY 100X72 PEDS (DRAPES) ×2 IMPLANT
DRAPE MICROSCOPE LEICA (MISCELLANEOUS) ×2 IMPLANT
DRAPE POUCH INSTRU U-SHP 10X18 (DRAPES) ×2 IMPLANT
DRILL BIT POWER (BIT) ×2
DRSG OPSITE POSTOP 3X4 (GAUZE/BANDAGES/DRESSINGS) ×2 IMPLANT
DURAPREP 6ML APPLICATOR 50/CS (WOUND CARE) ×2 IMPLANT
ELECT COATED BLADE 2.86 ST (ELECTRODE) ×2 IMPLANT
ELECT REM PT RETURN 9FT ADLT (ELECTROSURGICAL) ×2
ELECTRODE REM PT RTRN 9FT ADLT (ELECTROSURGICAL) ×1 IMPLANT
GAUZE SPONGE 4X4 16PLY XRAY LF (GAUZE/BANDAGES/DRESSINGS) IMPLANT
GLOVE BIO SURGEON STRL SZ8 (GLOVE) ×4 IMPLANT
GLOVE BIO SURGEON STRL SZ8.5 (GLOVE) ×1 IMPLANT
GLOVE BIOGEL PI IND STRL 6.5 (GLOVE) IMPLANT
GLOVE BIOGEL PI IND STRL 7.0 (GLOVE) ×2 IMPLANT
GLOVE BIOGEL PI INDICATOR 6.5 (GLOVE) ×2
GLOVE BIOGEL PI INDICATOR 7.0 (GLOVE) ×2
GOWN STRL REUS W/ TWL LRG LVL3 (GOWN DISPOSABLE) IMPLANT
GOWN STRL REUS W/ TWL XL LVL3 (GOWN DISPOSABLE) IMPLANT
GOWN STRL REUS W/TWL 2XL LVL3 (GOWN DISPOSABLE) ×1 IMPLANT
GOWN STRL REUS W/TWL LRG LVL3 (GOWN DISPOSABLE) ×2
GOWN STRL REUS W/TWL XL LVL3 (GOWN DISPOSABLE) ×4
HEMOSTAT POWDER KIT SURGIFOAM (HEMOSTASIS) ×2 IMPLANT
KIT BASIN OR (CUSTOM PROCEDURE TRAY) ×2 IMPLANT
KIT ROOM TURNOVER OR (KITS) ×2 IMPLANT
NDL HYPO 25X1 1.5 SAFETY (NEEDLE) ×1 IMPLANT
NDL SPNL 20GX3.5 QUINCKE YW (NEEDLE) ×1 IMPLANT
NEEDLE HYPO 25X1 1.5 SAFETY (NEEDLE) ×2 IMPLANT
NEEDLE SPNL 20GX3.5 QUINCKE YW (NEEDLE) ×2 IMPLANT
NS IRRIG 1000ML POUR BTL (IV SOLUTION) ×2 IMPLANT
PACK LAMINECTOMY NEURO (CUSTOM PROCEDURE TRAY) ×2 IMPLANT
PAD ARMBOARD 7.5X6 YLW CONV (MISCELLANEOUS) ×4 IMPLANT
PLATE ARCHON 1-LEVEL 22MM (Plate) ×1 IMPLANT
RUBBERBAND STERILE (MISCELLANEOUS) ×4 IMPLANT
SCREW ARCHON SELFTAP 4.0X13 (Screw) ×8 IMPLANT
SPONGE INTESTINAL PEANUT (DISPOSABLE) ×2 IMPLANT
SPONGE SURGIFOAM ABS GEL SZ50 (HEMOSTASIS) ×2 IMPLANT
STRIP CLOSURE SKIN 1/2X4 (GAUZE/BANDAGES/DRESSINGS) ×2 IMPLANT
SUT VIC AB 3-0 SH 8-18 (SUTURE) ×2 IMPLANT
TOWEL OR 17X24 6PK STRL BLUE (TOWEL DISPOSABLE) ×2 IMPLANT
TOWEL OR 17X26 10 PK STRL BLUE (TOWEL DISPOSABLE) ×2 IMPLANT
TRAP SPECIMEN MUCOUS 40CC (MISCELLANEOUS) ×1 IMPLANT
WATER STERILE IRR 1000ML POUR (IV SOLUTION) ×2 IMPLANT

## 2015-03-31 NOTE — Op Note (Signed)
03/31/2015  11:19 AM  PATIENT:  Monica Aguilar  55 y.o. 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 Decompressive anterior cervical discectomy C4-5, 2. Anterior cervical arthrodesis C4-5 utilizing a 6 mm peek interbody cage packed with morcellized autograft, 3. Anterior cervical plating C4-5 utilizing a nuvasive archon plate  SURGEON:  Marikay Alar, MD  ASSISTANTS: jenkins  ANESTHESIA:   General  EBL: 25 ml     BLOOD ADMINISTERED:none  DRAINS: none   SPECIMEN:  No Specimen  INDICATION FOR PROCEDURE: This patient percent with neck and left arm pain and numbness in her hand. MRI showed a large midline disc herniation at C4-5 with severe spinal stenosis and early signal change in the cord. I recommended decompressive surgery in the form of ACDF with plating at C4-5. Patient understood the risks, benefits, and alternatives and potential outcomes and wished to proceed.  PROCEDURE DETAILS: Patient was brought to the operating room placed under general endotracheal anesthesia. Patient was placed in the supine position on the operating room table. The neck was prepped with Duraprep and draped in a sterile fashion.   Three cc of local anesthesia was injected and a transverse incision was made on the right side of the neck.  Dissection was carried down thru the subcutaneous tissue and the platysma was  elevated, opened, and undermined with Metzenbaum scissors.  Dissection was then carried out thru an avascular plane leaving the sternocleidomastoid carotid artery and jugular vein laterally and the trachea and esophagus medially. The ventral aspect of the vertebral column was identified and a localizing x-ray was taken. The C4-5 level was identified. The longus colli muscles were then elevated and the retractor was placed. The annulus was incised and the disc space entered. Discectomy was performed with  micro-curettes and pituitary rongeurs. I then used the high-speed drill to drill the endplates down to the level of the posterior longitudinal ligament. The drill shavings were saved in a mucous trap for later arthrodesis. The operating microscope was draped and brought into the field provided additional magnification, illumination and visualization. Discectomy was continued posteriorly thru the disc space. There was a large tear in the posterior longitudinal ligament a large midline disc herniation that was removed. The dura relaxed as soon as this was done. Posterior longitudinal ligament was opened with a nerve hook, and then removed along with disc herniation and osteophytes, decompressing the spinal canal and thecal sac. We then continued to remove osteophytic overgrowth and disc material decompressing the neural foramina and exiting nerve roots bilaterally. The scope was angled up and down to help decompress and undercut the vertebral bodies. Once the decompression was completed we could pass a nerve hook circumferentially to assure adequate decompression in the midline and in the neural foramina. So by both visualization and palpation we felt we had an adequate decompression of the neural elements. We then measured the height of the intravertebral disc space and selected a 6 millimeter Peek interbody cage packed with autograft and morcellized allograft. It was then gently positioned in the intravertebral disc space and countersunk. I then used a 22 m plate and placed four variable angle screws into the vertebral bodies and locked them into position. The wound was irrigated with bacitracin solution, checked for hemostasis which was established and confirmed. Once meticulous hemostasis was achieved, we then proceeded with closure. The platysma was closed with interrupted 3-0 undyed Vicryl suture, the subcuticular layer was closed with  interrupted 3-0 undyed Vicryl suture. The skin edges were approximated with  steristrips. The drapes were removed. A sterile dressing was applied. The patient was then awakened from general anesthesia and transferred to the recovery room in stable condition. At the end of the procedure all sponge, needle and instrument counts were correct.   PLAN OF CARE: Admit for overnight observation  PATIENT DISPOSITION:  PACU - hemodynamically stable.   Delay start of Pharmacological VTE agent (>24hrs) due to surgical blood loss or risk of bleeding:  yes

## 2015-03-31 NOTE — Anesthesia Postprocedure Evaluation (Signed)
Anesthesia Post Note  Patient: Monica Aguilar  Procedure(s) Performed: Procedure(s) (LRB): CERVICAL FOUR-FIVE ANTERIOR CERVICAL DECOMPRESSION/DISCECTOMY (N/A)  Patient location during evaluation: PACU Anesthesia Type: General Level of consciousness: awake and alert Pain management: pain level controlled Vital Signs Assessment: post-procedure vital signs reviewed and stable Respiratory status: spontaneous breathing Cardiovascular status: blood pressure returned to baseline Anesthetic complications: no    Last Vitals:  Filed Vitals:   03/31/15 1330 03/31/15 1336  BP:    Pulse:    Temp:  36.2 C  Resp: 17     Last Pain:  Filed Vitals:   03/31/15 1336  PainSc: Asleep                 Kennieth Rad

## 2015-03-31 NOTE — Anesthesia Procedure Notes (Signed)
Procedure Name: Intubation Date/Time: 03/31/2015 9:47 AM Performed by: Orvilla Fus A Pre-anesthesia Checklist: Patient identified, Timeout performed, Emergency Drugs available, Suction available and Patient being monitored Patient Re-evaluated:Patient Re-evaluated prior to inductionOxygen Delivery Method: Circle system utilized Preoxygenation: Pre-oxygenation with 100% oxygen Intubation Type: IV induction Ventilation: Mask ventilation without difficulty and Oral airway inserted - appropriate to patient size Laryngoscope Size: Mac and 3 Grade View: Grade I Tube type: Oral Tube size: 7.0 mm Number of attempts: 1 Airway Equipment and Method: Stylet Placement Confirmation: ETT inserted through vocal cords under direct vision,  breath sounds checked- equal and bilateral and positive ETCO2 Secured at: 22 cm Tube secured with: Tape Dental Injury: Teeth and Oropharynx as per pre-operative assessment

## 2015-03-31 NOTE — Transfer of Care (Signed)
Immediate Anesthesia Transfer of Care Note  Patient: Monica Aguilar  Procedure(s) Performed: Procedure(s): CERVICAL FOUR-FIVE ANTERIOR CERVICAL DECOMPRESSION/DISCECTOMY (N/A)  Patient Location: PACU  Anesthesia Type:General  Level of Consciousness: awake, alert , oriented and patient cooperative  Airway & Oxygen Therapy: Patient Spontanous Breathing and Patient connected to nasal cannula oxygen  Post-op Assessment: Report given to RN, Post -op Vital signs reviewed and stable and Patient moving all extremities  Post vital signs: Reviewed and stable  Last Vitals:  Filed Vitals:   03/31/15 0756  BP: 145/87  Pulse: 84  Temp: 36.6 C  Resp: 20    Complications: No apparent anesthesia complications

## 2015-03-31 NOTE — Anesthesia Preprocedure Evaluation (Addendum)
Anesthesia Evaluation  Patient identified by MRN, date of birth, ID band Patient awake    Reviewed: Allergy & Precautions, NPO status , Patient's Chart, lab work & pertinent test results  Airway Mallampati: I  TM Distance: >3 FB Neck ROM: Full    Dental  (+) Dental Advisory Given   Pulmonary former smoker,    breath sounds clear to auscultation       Cardiovascular negative cardio ROS   Rhythm:Regular Rate:Normal     Neuro/Psych Cervical stenosis    GI/Hepatic Neg liver ROS, GERD  ,  Endo/Other  Hypothyroidism   Renal/GU negative Renal ROS     Musculoskeletal   Abdominal   Peds  Hematology negative hematology ROS (+)   Anesthesia Other Findings   Reproductive/Obstetrics                            Lab Results  Component Value Date   WBC 7.5 03/18/2015   HGB 12.7 03/18/2015   HCT 37.8 03/18/2015   MCV 93.3 03/18/2015   PLT 291 03/18/2015   Lab Results  Component Value Date   CREATININE 0.77 03/18/2015   BUN 13 03/18/2015   NA 139 03/18/2015   K 3.7 03/18/2015   CL 105 03/18/2015   CO2 24 03/18/2015    Anesthesia Physical Anesthesia Plan  ASA: II  Anesthesia Plan: General   Post-op Pain Management:    Induction: Intravenous  Airway Management Planned: Oral ETT  Additional Equipment:   Intra-op Plan:   Post-operative Plan: Extubation in OR  Informed Consent: I have reviewed the patients History and Physical, chart, labs and discussed the procedure including the risks, benefits and alternatives for the proposed anesthesia with the patient or authorized representative who has indicated his/her understanding and acceptance.   Dental advisory given  Plan Discussed with: CRNA  Anesthesia Plan Comments:         Anesthesia Quick Evaluation

## 2015-03-31 NOTE — H&P (Signed)
Subjective:   Patient is a 55 y.o. female admitted for ACDF. The patient first presented to me with complaints of neck pain, shooting pains in the arm(s) and numbness of the arm(s). Onset of symptoms was several months ago. The pain is described as aching and occurs intermittently. The pain is rated moderate, and is located  In the neck and radiates to the arms. The symptoms have been progressive. Symptoms are exacerbated by extending head backwards, and are relieved by none.  Previous work up includes MRI of cervical spine, results: spinal stenosis.  Past Medical History  Diagnosis Date  . Diverticulosis   . Irritable bowel syndrome   . Hypothyroidism   . Headache     Past Surgical History  Procedure Laterality Date  . Thyroidectomy  2003/2009  . Abdominal hysterectomy  2006  . Tubal ligation  1991  . Appendectomy      Allergies  Allergen Reactions  . Morphine Itching    Nothing serious    Social History  Substance Use Topics  . Smoking status: Former Games developer  . Smokeless tobacco: Never Used  . Alcohol Use: No    Family History  Problem Relation Age of Onset  . Irritable bowel syndrome Sister   . Colon cancer Neg Hx    Prior to Admission medications   Medication Sig Start Date End Date Taking? Authorizing Provider  atorvastatin (LIPITOR) 10 MG tablet Take 10 mg by mouth every evening.   Yes Historical Provider, MD  cetirizine (ZYRTEC) 10 MG tablet Take 10 mg by mouth daily as needed for allergies.    Yes Historical Provider, MD  cholecalciferol (VITAMIN D) 1000 UNITS tablet Take 2,000 Units by mouth daily.   Yes Historical Provider, MD  Coral Calcium 1000 (390 CA) MG TABS Take 2,000 tablets by mouth daily.   Yes Historical Provider, MD  dicyclomine (BENTYL) 10 MG capsule Take 10 mg by mouth as needed.   Yes Historical Provider, MD  diphenoxylate-atropine (LOMOTIL) 2.5-0.025 MG tablet Take 1 tablet by mouth 4 (four) times daily as needed for diarrhea or loose stools.   Yes  Historical Provider, MD  Eluxadoline (VIBERZI) 100 MG TABS Take 1 tablet by mouth daily as needed.   Yes Historical Provider, MD  estradiol (ESTRACE) 1 MG tablet Take 1 mg by mouth daily.   Yes Historical Provider, MD  fluticasone (FLONASE) 50 MCG/ACT nasal spray Place 2 sprays into both nostrils every morning.   Yes Historical Provider, MD  levothyroxine (SYNTHROID, LEVOTHROID) 88 MCG tablet Take 88 mcg by mouth daily before breakfast.   Yes Historical Provider, MD  montelukast (SINGULAIR) 10 MG tablet Take 10 mg by mouth at bedtime as needed.    Yes Historical Provider, MD  Multiple Vitamin (MULTIVITAMIN) tablet Take 1 tablet by mouth daily.   Yes Historical Provider, MD  Probiotic Product (MISC INTESTINAL FLORA REGULAT) TABS Take 1 tablet by mouth daily.    Yes Historical Provider, MD     Review of Systems  Positive ROS: neg  All other systems have been reviewed and were otherwise negative with the exception of those mentioned in the HPI and as above.  Objective: Vital signs in last 24 hours:    General Appearance: Alert, cooperative, no distress, appears stated age Head: Normocephalic, without obvious abnormality, atraumatic Eyes: PERRL, conjunctiva/corneas clear, EOM's intact      Neck: Supple, symmetrical, trachea midline, Back: Symmetric, no curvature, ROM normal, no CVA tenderness Lungs:  respirations unlabored Heart: Regular rate and rhythm Abdomen:  Soft, non-tender Extremities: Extremities normal, atraumatic, no cyanosis or edema Pulses: 2+ and symmetric all extremities Skin: Skin color, texture, turgor normal, no rashes or lesions  NEUROLOGIC:  Mental status: Alert and oriented x4, no aphasia, good attention span, fund of knowledge and memory  Motor Exam - grossly normal Sensory Exam - grossly normal Reflexes: 1= Coordination - grossly normal Gait - grossly normal Balance - grossly normal Cranial Nerves: I: smell Not tested  II: visual acuity  OS: nl    OD: nl   II: visual fields Full to confrontation  II: pupils Equal, round, reactive to light  III,VII: ptosis None  III,IV,VI: extraocular muscles  Full ROM  V: mastication Normal  V: facial light touch sensation  Normal  V,VII: corneal reflex  Present  VII: facial muscle function - upper  Normal  VII: facial muscle function - lower Normal  VIII: hearing Not tested  IX: soft palate elevation  Normal  IX,X: gag reflex Present  XI: trapezius strength  5/5  XI: sternocleidomastoid strength 5/5  XI: neck flexion strength  5/5  XII: tongue strength  Normal    Data Review Lab Results  Component Value Date   WBC 7.5 03/18/2015   HGB 12.7 03/18/2015   HCT 37.8 03/18/2015   MCV 93.3 03/18/2015   PLT 291 03/18/2015   Lab Results  Component Value Date   NA 139 03/18/2015   K 3.7 03/18/2015   CL 105 03/18/2015   CO2 24 03/18/2015   BUN 13 03/18/2015   CREATININE 0.77 03/18/2015   GLUCOSE 103* 03/18/2015   Lab Results  Component Value Date   INR 1.02 03/18/2015    Assessment:   Cervical neck pain with herniated nucleus pulposus/ spondylosis/ stenosis at C45. Patient has failed conservative therapy. Planned surgery : C4-5 ACDF  Plan:   I explained the condition and procedure to the patient and answered any questions.  Patient wishes to proceed with procedure as planned. Understands risks/ benefits/ and expected or typical outcomes.  Aleatha Taite S 03/31/2015 7:29 AM

## 2015-04-01 ENCOUNTER — Encounter (HOSPITAL_COMMUNITY): Payer: Self-pay | Admitting: Neurological Surgery

## 2015-04-01 DIAGNOSIS — M50221 Other cervical disc displacement at C4-C5 level: Secondary | ICD-10-CM | POA: Diagnosis not present

## 2015-04-01 MED ORDER — OXYCODONE-ACETAMINOPHEN 5-325 MG PO TABS: 1.0000 | ORAL_TABLET | Freq: Four times a day (QID) | ORAL | Status: DC | PRN

## 2015-04-01 MED ORDER — LOPERAMIDE HCL 2 MG PO CAPS
2.0000 mg | ORAL_CAPSULE | ORAL | Status: DC | PRN
  Filled 2015-04-01: qty 1

## 2015-04-01 NOTE — Progress Notes (Addendum)
Patient alert and oriented, mae's well, voiding adequate amount of urine, swallowing without difficulty, no c/o pain. Patient discharged home with family. Script and discharged instructions given to patient. Patient and family stated understanding of d/c instructions given and has an appointment with MD. 

## 2015-04-01 NOTE — Discharge Summary (Signed)
Physician Discharge Summary  Patient ID: Monica Aguilar MRN: [id number] DOB/AGE: 1960-05-01 55 y.o.  Admit date: 03/31/2015 Discharge date: 04/01/2015  Admission Diagnoses: cervical stenosis    Discharge Diagnoses: same   Discharged Condition: good  Hospital Course: The patient was admitted on 03/31/2015 and taken to the operating room where the patient underwent ACDF C4-5. The patient tolerated the procedure well and was taken to the recovery room and then to the floor in stable condition. The hospital course was routine. There were no complications. The wound remained clean dry and intact. Pt had appropriate neck soreness. No complaints of arm pain or new N/T/W. L hand already better.The patient remained afebrile with stable vital signs, and tolerated a regular diet. The patient continued to increase activities, and pain was well controlled with oral pain medications.   Consults: None  Significant Diagnostic Studies:  Results for orders placed or performed during the hospital encounter of 03/18/15  Surgical pcr screen  Result Value Ref Range   MRSA, PCR NEGATIVE NEGATIVE   Staphylococcus aureus NEGATIVE NEGATIVE  Basic metabolic panel  Result Value Ref Range   Sodium 139 135 - 145 mmol/L   Potassium 3.7 3.5 - 5.1 mmol/L   Chloride 105 101 - 111 mmol/L   CO2 24 22 - 32 mmol/L   Glucose, Bld 103 (H) 65 - 99 mg/dL   BUN 13 6 - 20 mg/dL   Creatinine, Ser 4.09 0.44 - 1.00 mg/dL   Calcium 9.1 8.9 - 81.1 mg/dL   GFR calc non Af Amer >60 >60 mL/min   GFR calc Af Amer >60 >60 mL/min   Anion gap 10 5 - 15  CBC WITH DIFFERENTIAL  Result Value Ref Range   WBC 7.5 4.0 - 10.5 K/uL   RBC 4.05 3.87 - 5.11 MIL/uL   Hemoglobin 12.7 12.0 - 15.0 g/dL   HCT 91.4 78.2 - 95.6 %   MCV 93.3 78.0 - 100.0 fL   MCH 31.4 26.0 - 34.0 pg   MCHC 33.6 30.0 - 36.0 g/dL   RDW 21.3 08.6 - 57.8 %   Platelets 291 150 - 400 K/uL   Neutrophils Relative % 53 %   Neutro Abs 4.0 1.7 - 7.7 K/uL   Lymphocytes  Relative 38 %   Lymphs Abs 2.8 0.7 - 4.0 K/uL   Monocytes Relative 7 %   Monocytes Absolute 0.5 0.1 - 1.0 K/uL   Eosinophils Relative 1 %   Eosinophils Absolute 0.1 0.0 - 0.7 K/uL   Basophils Relative 1 %   Basophils Absolute 0.1 0.0 - 0.1 K/uL  Protime-INR  Result Value Ref Range   Prothrombin Time 13.6 11.6 - 15.2 seconds   INR 1.02 0.00 - 1.49    Chest 2 View  03/18/2015  CLINICAL DATA:  55 year old with cervical spinal stenosis. Preoperative respiratory evaluation prior to C4-5 ACDF. EXAM: CHEST  2 VIEW COMPARISON:  None. FINDINGS: Cardiomediastinal silhouette unremarkable. Lungs clear. Bronchovascular markings normal. Pulmonary vascularity normal. No pneumothorax. No pleural effusions. Visualized bony thorax intact. IMPRESSION: Normal examination. Electronically Signed   By: Hulan Saas M.D.   On: 03/18/2015 10:13   Dg Cervical Spine 1 View  03/31/2015  CLINICAL DATA:  C4-5 ACDF *7 secs fl time used* EXAM: DG C-ARM 61-120 MIN; DG CERVICAL SPINE - 1 VIEW COMPARISON:  02/16/2015 FINDINGS: Single cross-table lateral view is performed, demonstrating endotracheal tube in place. There has been anterior fusion of C4-5 with interbody fusion device and anterior screw plate. Alignment appears normal. IMPRESSION:  Status post anterior fusion C4-5. Electronically Signed   By: Norva Pavlov M.D.   On: 03/31/2015 11:33   Dg C-arm 1-60 Min  03/31/2015  CLINICAL DATA:  C4-5 ACDF *7 secs fl time used* EXAM: DG C-ARM 61-120 MIN; DG CERVICAL SPINE - 1 VIEW COMPARISON:  02/16/2015 FINDINGS: Single cross-table lateral view is performed, demonstrating endotracheal tube in place. There has been anterior fusion of C4-5 with interbody fusion device and anterior screw plate. Alignment appears normal. IMPRESSION: Status post anterior fusion C4-5. Electronically Signed   By: Norva Pavlov M.D.   On: 03/31/2015 11:33    Antibiotics:  Anti-infectives    Start     Dose/Rate Route Frequency Ordered Stop    03/31/15 1730  ceFAZolin (ANCEF) IVPB 1 g/50 mL premix     1 g 100 mL/hr over 30 Minutes Intravenous Every 8 hours 03/31/15 1409 04/01/15 0128   03/31/15 1023  bacitracin 50,000 Units in sodium chloride irrigation 0.9 % 500 mL irrigation  Status:  Discontinued       As needed 03/31/15 1024 03/31/15 1114   03/31/15 0700  ceFAZolin (ANCEF) IVPB 2 g/50 mL premix     2 g 100 mL/hr over 30 Minutes Intravenous To [facility] 03/30/15 1032 03/31/15 0940      Discharge Exam: Blood pressure 125/65, pulse 80, temperature 98.4 F (36.9 C), temperature source Oral, resp. rate 16, SpO2 98 %. Neurologic: Grossly normal Dressing dry  Discharge Medications:     Medication List    TAKE these medications        atorvastatin 10 MG tablet  Commonly known as:  LIPITOR  Take 10 mg by mouth every evening.     cetirizine 10 MG tablet  Commonly known as:  ZYRTEC  Take 10 mg by mouth daily as needed for allergies.     cholecalciferol 1000 units tablet  Commonly known as:  VITAMIN D  Take 2,000 Units by mouth daily.     Coral Calcium 1000 (390 Ca) MG Tabs  Take 2,000 tablets by mouth daily.     dicyclomine 10 MG capsule  Commonly known as:  BENTYL  Take 10 mg by mouth as needed.     diphenoxylate-atropine 2.5-0.025 MG tablet  Commonly known as:  LOMOTIL  Take 1 tablet by mouth 4 (four) times daily as needed for diarrhea or loose stools.     estradiol 1 MG tablet  Commonly known as:  ESTRACE  Take 1 mg by mouth daily.     fluticasone 50 MCG/ACT nasal spray  Commonly known as:  FLONASE  Place 2 sprays into both nostrils every morning.     levothyroxine 88 MCG tablet  Commonly known as:  SYNTHROID, LEVOTHROID  Take 88 mcg by mouth daily before breakfast.     Misc Intestinal Flora Regulat Tabs  Take 1 tablet by mouth daily.     montelukast 10 MG tablet  Commonly known as:  SINGULAIR  Take 10 mg by mouth at bedtime as needed.     multivitamin tablet  Take 1 tablet by mouth  daily.     oxyCODONE-acetaminophen 5-325 MG tablet  Commonly known as:  PERCOCET/ROXICET  Take 1-2 tablets by mouth every 6 (six) hours as needed for moderate pain.     VIBERZI 100 MG Tabs  Generic drug:  Eluxadoline  Take 1 tablet by mouth daily as needed.        Disposition: home   Final Dx: ACDF C4-5  Discharge Instructions     Remove dressing in 72 hours    Complete by:  As directed      Call MD for:  difficulty breathing, headache or visual disturbances    Complete by:  As directed      Call MD for:  persistant nausea and vomiting    Complete by:  As directed      Call MD for:  redness, tenderness, or signs of infection (pain, swelling, redness, odor or green/yellow discharge around incision site)    Complete by:  As directed      Call MD for:  severe uncontrolled pain    Complete by:  As directed      Call MD for:  temperature >100.4    Complete by:  As directed      Diet - low sodium heart healthy    Complete by:  As directed      Discharge instructions    Complete by:  As directed   No driving, no heavy lifting, may shower     Increase activity slowly    Complete by:  As directed               Signed: Micah Barnier S 04/01/2015, 9:43 AM

## 2017-09-30 ENCOUNTER — Emergency Department (HOSPITAL_BASED_OUTPATIENT_CLINIC_OR_DEPARTMENT_OTHER): Payer: Managed Care, Other (non HMO)

## 2017-09-30 ENCOUNTER — Encounter (HOSPITAL_BASED_OUTPATIENT_CLINIC_OR_DEPARTMENT_OTHER): Payer: Self-pay | Admitting: Emergency Medicine

## 2017-09-30 ENCOUNTER — Emergency Department (HOSPITAL_BASED_OUTPATIENT_CLINIC_OR_DEPARTMENT_OTHER)
Admission: EM | Admit: 2017-09-30 | Discharge: 2017-09-30 | Disposition: A | Discharge status: Home / Self Care | Payer: Managed Care, Other (non HMO) | Attending: Emergency Medicine | Admitting: Emergency Medicine

## 2017-09-30 ENCOUNTER — Other Ambulatory Visit: Payer: Self-pay

## 2017-09-30 DIAGNOSIS — E039 Hypothyroidism, unspecified: Secondary | ICD-10-CM | POA: Insufficient documentation

## 2017-09-30 DIAGNOSIS — Y92513 Shop (commercial) as the place of occurrence of the external cause: Secondary | ICD-10-CM | POA: Insufficient documentation

## 2017-09-30 DIAGNOSIS — S8392XA Sprain of unspecified site of left knee, initial encounter: Secondary | ICD-10-CM | POA: Diagnosis not present

## 2017-09-30 DIAGNOSIS — M545 Low back pain, unspecified: Secondary | ICD-10-CM

## 2017-09-30 DIAGNOSIS — Y9389 Activity, other specified: Secondary | ICD-10-CM | POA: Insufficient documentation

## 2017-09-30 DIAGNOSIS — S8391XA Sprain of unspecified site of right knee, initial encounter: Secondary | ICD-10-CM | POA: Insufficient documentation

## 2017-09-30 DIAGNOSIS — W010XXA Fall on same level from slipping, tripping and stumbling without subsequent striking against object, initial encounter: Secondary | ICD-10-CM | POA: Diagnosis not present

## 2017-09-30 DIAGNOSIS — Y999 Unspecified external cause status: Secondary | ICD-10-CM | POA: Diagnosis not present

## 2017-09-30 DIAGNOSIS — Z79899 Other long term (current) drug therapy: Secondary | ICD-10-CM | POA: Diagnosis not present

## 2017-09-30 DIAGNOSIS — S8990XA Unspecified injury of unspecified lower leg, initial encounter: Secondary | ICD-10-CM | POA: Diagnosis present

## 2017-09-30 MED ORDER — ACETAMINOPHEN 500 MG PO TABS
1000.0000 mg | ORAL_TABLET | Freq: Once | ORAL | Status: AC
  Administered 2017-09-30: 1000 mg via ORAL
  Filled 2017-09-30: qty 2

## 2017-09-30 MED ORDER — NAPROXEN 250 MG PO TABS
500.0000 mg | ORAL_TABLET | Freq: Once | ORAL | Status: AC
  Administered 2017-09-30: 500 mg via ORAL
  Filled 2017-09-30: qty 2

## 2017-09-30 NOTE — ED Provider Notes (Signed)
[facility] EMERGENCY DEPARTMENT Provider Note   CSN: [id number] Arrival date & time: 09/30/17  5027     History   Chief Complaint Chief Complaint  Patient presents with  . Fall    HPI Monica Aguilar is a 57 y.o. female.  57 yo F with a chief complaint of a fall.  This happened yesterday.  She was walking through a grocery store and stepped on a grape that was left on the ground and she slipped think she landed on her knees and then struck the left side of her body.  Complaining of pain to the knees bilaterally worse on the right than the left.  Also low back pain.  Describes the pain is diffuse.  Worse with ambulation.  She denies head injury loss of consciousness neck pain back pain chest pain or abdominal pain.  She denies trouble breathing.  The history is provided by the patient.  Fall  This is a new problem. The current episode started yesterday. The problem occurs constantly. The problem has been gradually worsening. Pertinent negatives include no chest pain, no abdominal pain, no headaches and no shortness of breath. The symptoms are aggravated by bending, twisting and walking. Nothing relieves the symptoms. Treatments tried: naproxyn. The treatment provided mild relief.    Past Medical History:  Diagnosis Date  . Diverticulosis   . Headache   . Hypothyroidism   . Irritable bowel syndrome     Patient Active Problem List   Diagnosis Date Noted  . S/P cervical spinal fusion 03/31/2015  . ESOPHAGEAL REFLUX 06/17/2008  . IRRITABLE BOWEL SYNDROME 04/21/2007  . DIARRHEA, CHRONIC 04/21/2007  . DIVERTICULOSIS, COLON 09/10/2006    Past Surgical History:  Procedure Laterality Date  . ABDOMINAL HYSTERECTOMY  2006  . ANTERIOR CERVICAL DECOMP/DISCECTOMY FUSION N/A 03/31/2015   Procedure: CERVICAL FOUR-FIVE ANTERIOR CERVICAL DECOMPRESSION/DISCECTOMY;  Surgeon: Tia Alert, MD;  Location: [facility];  Service: Neurosurgery;  Laterality: N/A;  . APPENDECTOMY    .  THYROIDECTOMY  2003/2009  . TUBAL LIGATION  1991     OB History   None      Home Medications    Prior to Admission medications   Medication Sig Start Date End Date Taking? Authorizing Provider  hyoscyamine (ANASPAZ) 0.125 MG TBDP disintergrating tablet Place 0.125 mg under the tongue every 6 (six) hours.   Yes [provider]  atorvastatin (LIPITOR) 10 MG tablet Take 10 mg by mouth every evening.    [provider]  cetirizine (ZYRTEC) 10 MG tablet Take 10 mg by mouth daily as needed for allergies.     [provider]  cholecalciferol (VITAMIN D) 1000 UNITS tablet Take 2,000 Units by mouth daily.    [provider]  Coral Calcium 1000 (390 CA) MG TABS Take 2,000 tablets by mouth daily.    [provider]  diphenoxylate-atropine (LOMOTIL) 2.5-0.025 MG tablet Take 1 tablet by mouth 4 (four) times daily as needed for diarrhea or loose stools.    [provider]  Eluxadoline (VIBERZI) 100 MG TABS Take 1 tablet by mouth daily as needed.    [provider]  estradiol (ESTRACE) 1 MG tablet Take 1 mg by mouth daily.    [provider]  fluticasone (FLONASE) 50 MCG/ACT nasal spray Place 2 sprays into both nostrils every morning.    [provider]  levothyroxine (SYNTHROID, LEVOTHROID) 88 MCG tablet Take 88 mcg by mouth daily before breakfast.    [provider]  montelukast (SINGULAIR) 10 MG tablet Take 10 mg by mouth at bedtime as needed.     [provider]  Multiple Vitamin (MULTIVITAMIN) tablet Take 1 tablet by mouth daily.    [provider]  Probiotic Product (MISC INTESTINAL FLORA REGULAT) TABS Take 1 tablet by mouth daily.     [provider]    Family History Family History  Problem Relation Age of Onset  . Irritable bowel syndrome Sister   . Colon cancer Neg Hx     Social History Social History   Tobacco Use  . Smoking status: Former Games developer  . Smokeless  tobacco: Never Used  Substance Use Topics  . Alcohol use: No  . Drug use: No     Allergies   Morphine   Review of Systems Review of Systems  Constitutional: Negative for chills and fever.  HENT: Negative for congestion and rhinorrhea.   Eyes: Negative for redness and visual disturbance.  Respiratory: Negative for shortness of breath and wheezing.   Cardiovascular: Negative for chest pain and palpitations.  Gastrointestinal: Negative for abdominal pain, nausea and vomiting.  Genitourinary: Negative for dysuria and urgency.  Musculoskeletal: Positive for arthralgias, back pain and gait problem. Negative for myalgias.  Skin: Negative for pallor and wound.  Neurological: Negative for dizziness and headaches.     Physical Exam Updated Vital Signs BP 125/73 (BP Location: Right Arm)   Pulse 91   Temp 98.6 F (37 C) (Oral)   Resp 16   Ht 5\' 2"  (1.575 m)   Wt 72.1 kg   SpO2 100%   BMI 29.08 kg/m   Physical Exam  Constitutional: She is oriented to person, place, and time. She appears well-developed and well-nourished. No distress.  HENT:  Head: Normocephalic and atraumatic.  Eyes: Pupils are equal, round, and reactive to light. EOM are normal.  Neck: Normal range of motion. Neck supple.  Cardiovascular: Normal rate and regular rhythm. Exam reveals no gallop and no friction rub.  No murmur heard. Pulmonary/Chest: Effort normal. She has no wheezes. She has no rales.  Abdominal: Soft. She exhibits no distension. There is no tenderness.  Musculoskeletal: She exhibits tenderness. She exhibits no edema.  No noted effusion.  No ligamentous laxity.  McMurray's test negative bilaterally.  Patient does have pain to the attachment of the hamstring to the tibia on the medial aspect bilaterally.  She has mild diffuse low back pain.  No midline spinal tenderness.  Neurological: She is alert and oriented to person, place, and time.  Skin: Skin is warm and dry. She is not diaphoretic.    Psychiatric: She has a normal mood and affect. Her behavior is normal.  Nursing note and vitals reviewed.    ED Treatments / Results  Labs (all labs ordered are listed, but only abnormal results are displayed) Labs Reviewed - No data to display  EKG None  Radiology Dg Lumbar Spine Complete  Result Date: 09/30/2017 CLINICAL DATA:  Low back pain since an injury suffered in a fall last night. Initial encounter. EXAM: LUMBAR SPINE - COMPLETE 4+ VIEW COMPARISON:  None. FINDINGS: There is no evidence of lumbar spine fracture. Alignment is normal. Intervertebral disc spaces are maintained. IMPRESSION: Negative exam. Electronically Signed   By: Drusilla Kanner M.D.   On: 09/30/2017 10:24   Dg Knee Complete 4 Views Left  Result Date: 09/30/2017 CLINICAL DATA:  Left knee pain since an injury suffered in a fall last night. Initial encounter. EXAM: LEFT KNEE - COMPLETE 4+  VIEW COMPARISON:  None. FINDINGS: No evidence of fracture, dislocation, or joint effusion. No evidence of arthropathy or other focal bone abnormality. Soft tissues are unremarkable. IMPRESSION: Negative exam. Electronically Signed   By: Drusilla Kanner M.D.   On: 09/30/2017 10:23   Dg Knee Complete 4 Views Right  Result Date: 09/30/2017 CLINICAL DATA:  57 year old female with bilateral knee pains worse on the right than the left since falling last night EXAM: RIGHT KNEE - COMPLETE 4+ VIEW COMPARISON:  Concurrently obtained radiographs of the left knee FINDINGS: No evidence of fracture, dislocation, or joint effusion. No evidence of arthropathy or other focal bone abnormality. Soft tissues are unremarkable. IMPRESSION: Negative. Electronically Signed   By: Malachy Moan M.D.   On: 09/30/2017 10:24    Procedures Procedures (including critical care time)  Medications Ordered in ED Medications  acetaminophen (TYLENOL) tablet 1,000 mg (1,000 mg Oral Given 09/30/17 1016)  naproxen (NAPROSYN) tablet 500 mg (500 mg Oral Given 09/30/17  1016)     Initial Impression / Assessment and Plan / ED Course  I have reviewed the triage vital signs and the nursing notes.  Pertinent labs & imaging results that were available during my care of the patient were reviewed by me and considered in my medical decision making (see chart for details).     57 yo F with a chief complaint of a fall.  Mechanical in nature.  Complaining of right and left knee pain and low back pain.  Plain films of these are negative for acute fracture is viewed by me..  Discharge home.  10:35 AM:  I have discussed the diagnosis/risks/treatment options with the patient and believe the pt to be eligible for discharge home to follow-up with PCP. We also discussed returning to the ED immediately if new or worsening sx occur. We discussed the sx which are most concerning (e.g., sudden worsening pain, fever, inability to tolerate by mouth) that necessitate immediate return. Medications administered to the patient during their visit and any new prescriptions provided to the patient are listed below.  Medications given during this visit Medications  acetaminophen (TYLENOL) tablet 1,000 mg (1,000 mg Oral Given 09/30/17 1016)  naproxen (NAPROSYN) tablet 500 mg (500 mg Oral Given 09/30/17 1016)      The patient appears reasonably screen and/or stabilized for discharge and I doubt any other medical condition or other [facility] requiring further screening, evaluation, or treatment in the ED at this time prior to discharge.    Final Clinical Impressions(s) / ED Diagnoses   Final diagnoses:  Knee sprain, bilateral  Acute bilateral low back pain without sciatica    ED Discharge Orders    None       Melene Plan, DO 09/30/17 1035

## 2017-09-30 NOTE — Discharge Instructions (Signed)
Take 4 over the counter ibuprofen tablets 3 times a day or 2 over-the-counter naproxen tablets twice a day for pain. Also take tylenol 1000mg (2 extra strength) four times a day.    Follow up with your family doctor in about a week.  Return for sudden worsening pain.

## 2017-09-30 NOTE — ED Triage Notes (Signed)
Fell yesterday on slippery substance.  Pain to both knees - right is worse, low back and left elbow.  Pt ambulating with slight limp at this time.

## 2018-09-28 IMAGING — CR DG LUMBAR SPINE COMPLETE 4+V
5 series · 5 of 5 positions shown · non-contrast
Comparison: None.

CLINICAL DATA: Low back pain since an injury suffered in a fall
last night. Initial encounter.

EXAM:
LUMBAR SPINE - COMPLETE 4+ VIEW

[t l-spine a.p.]
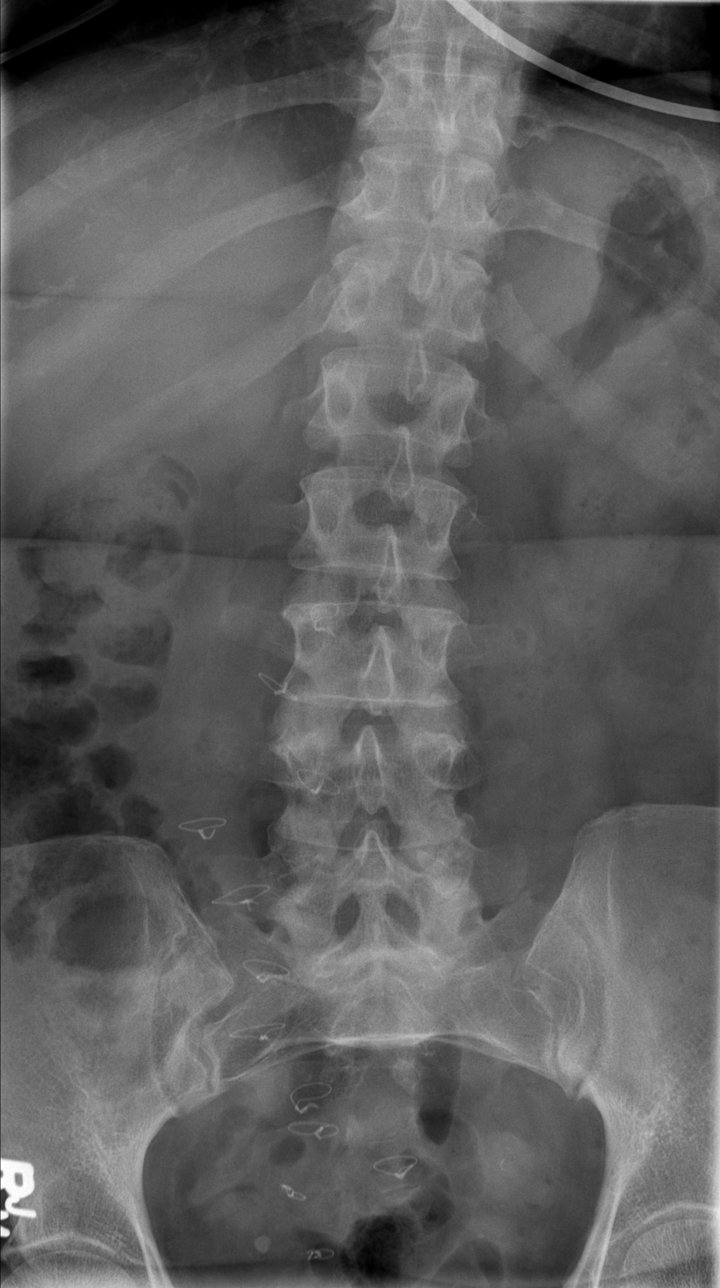

[t l-spine oblique exposure (1 of 2)]
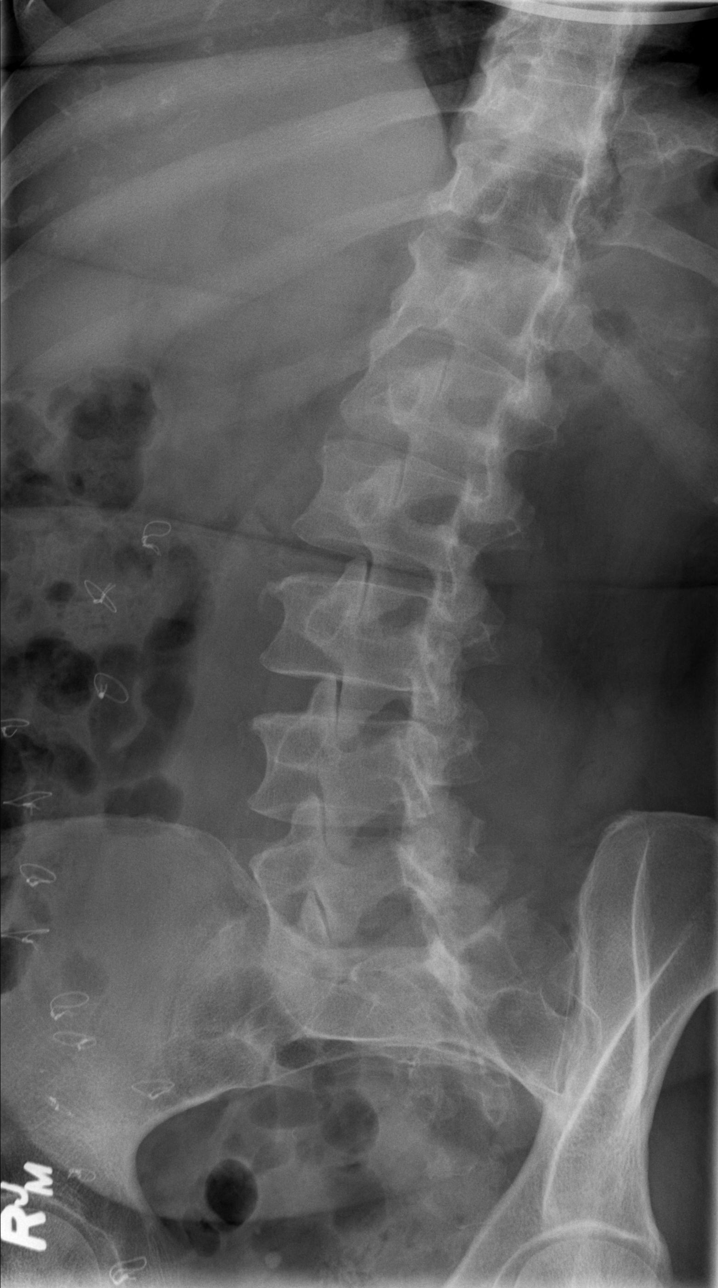

[t l-spine oblique exposure (2 of 2)]
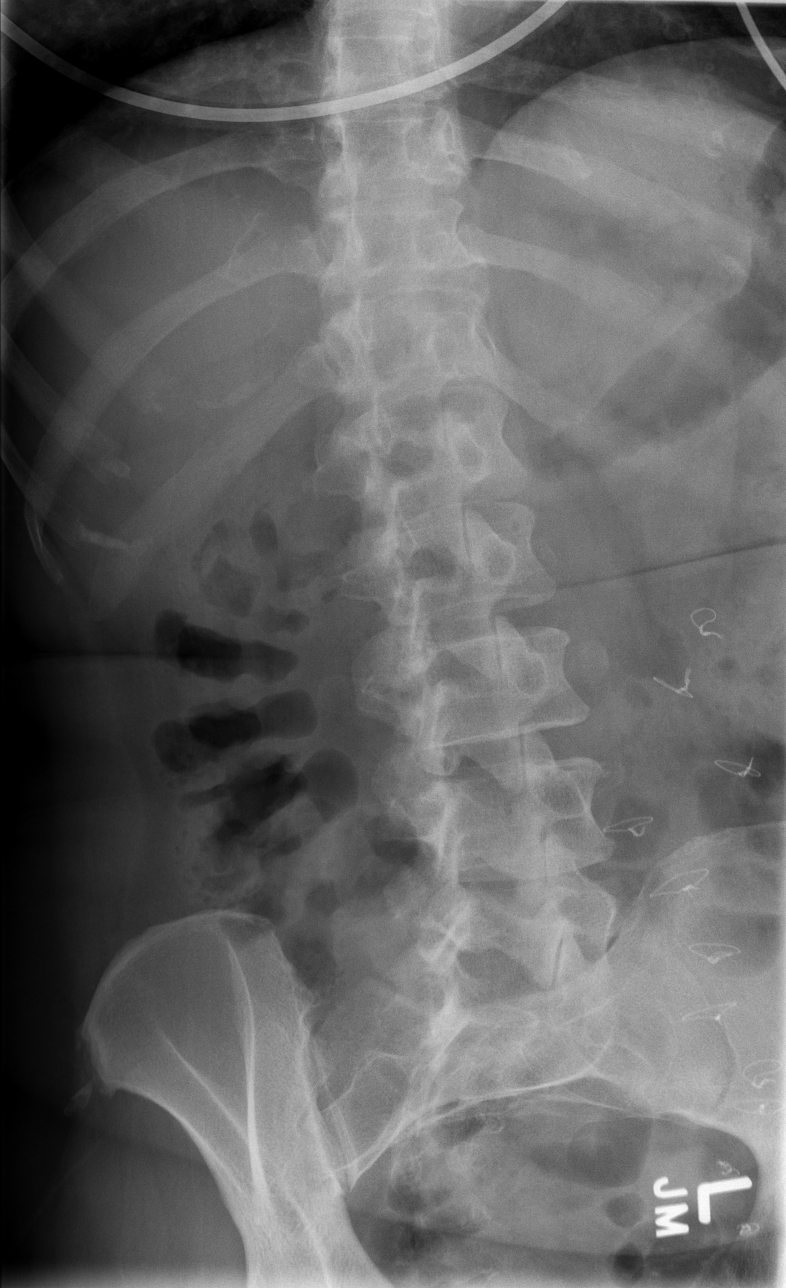

[t l-spine lat]
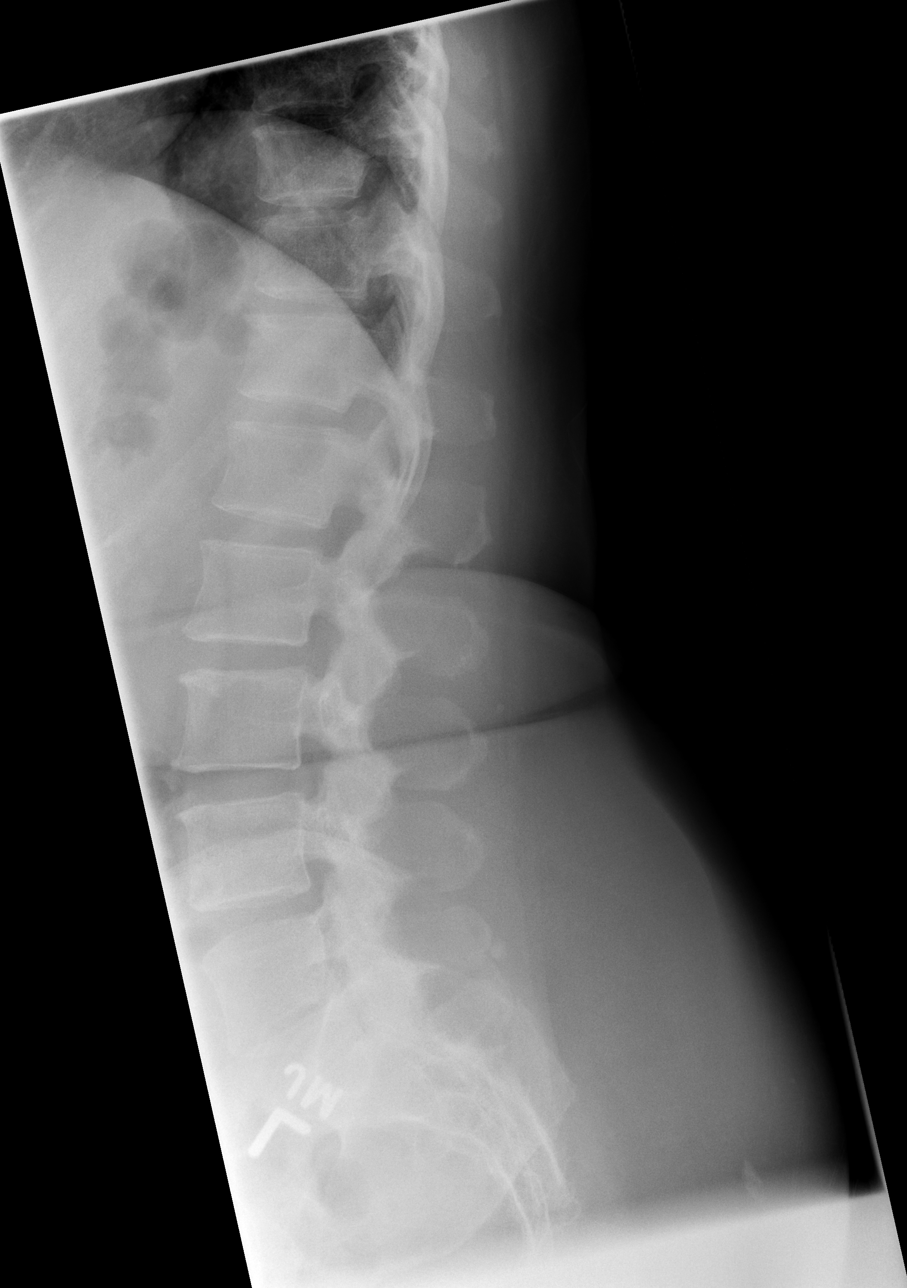

[t l-spine l5-s1 spot]
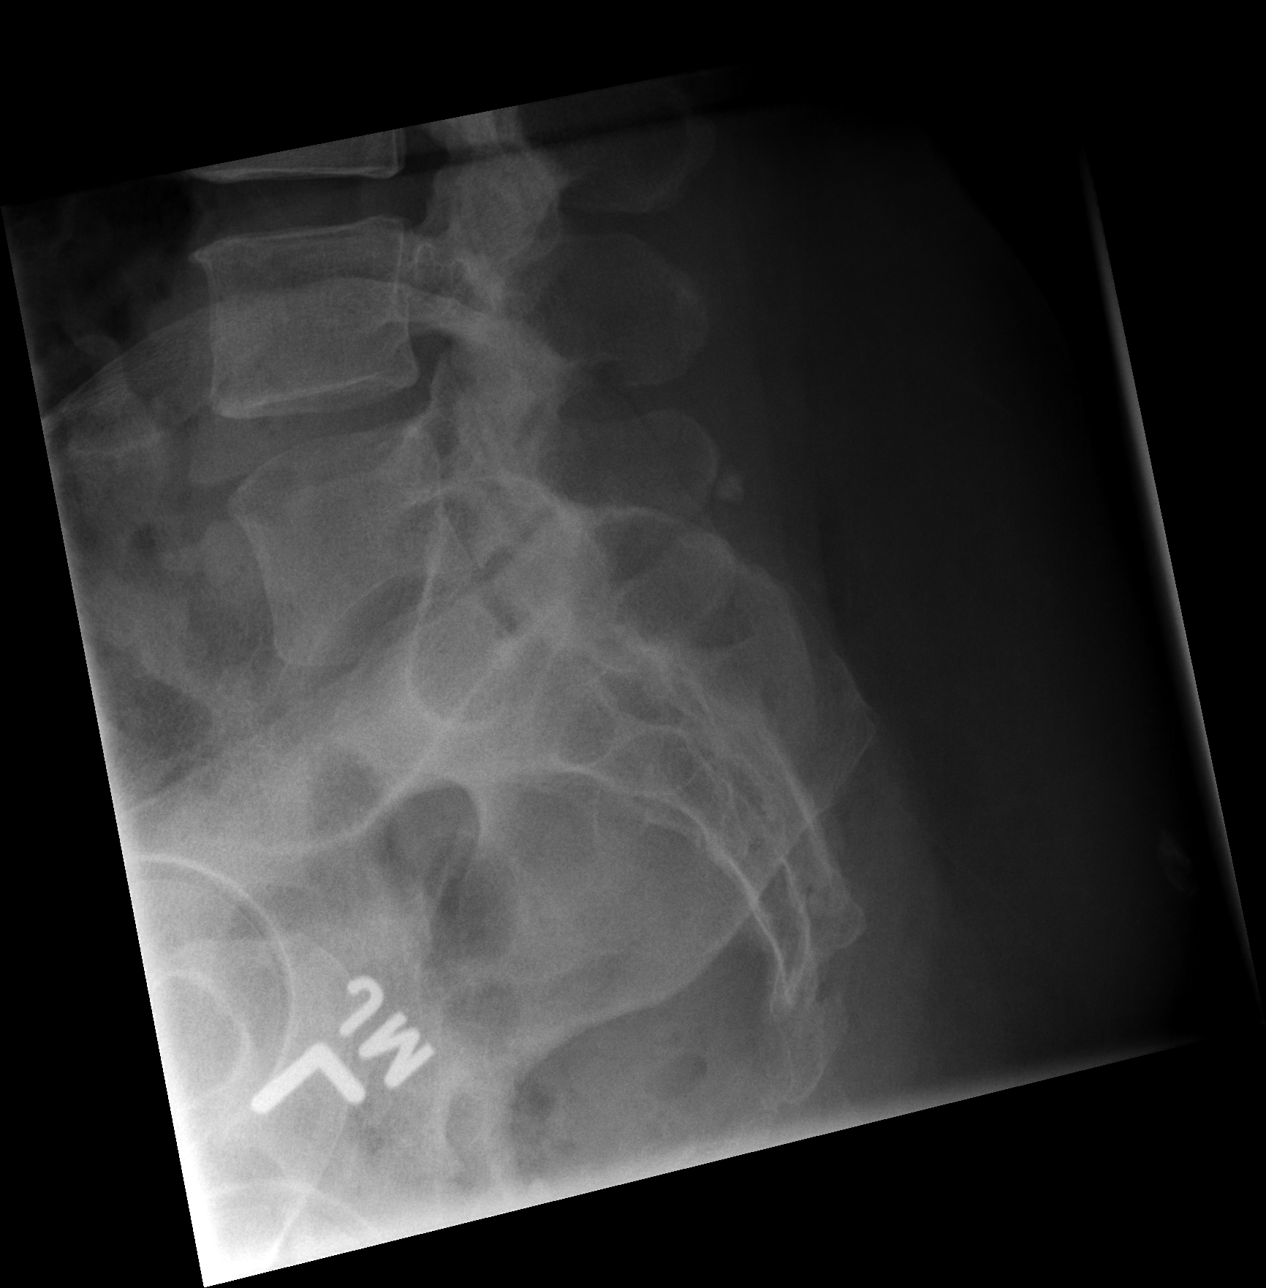

[5 of 5 positions shown; findings below may reference images not displayed]

FINDINGS: There is no evidence of lumbar spine fracture. Alignment is normal.
Intervertebral disc spaces are maintained.
IMPRESSION: Negative exam.

## 2018-09-28 IMAGING — CR DG KNEE COMPLETE 4+V*L*
4 series · 4 of 4 positions shown · non-contrast
Comparison: None.

CLINICAL DATA: Left knee pain since an injury suffered in a fall
last night. Initial encounter.

EXAM:
LEFT KNEE - COMPLETE 4+ VIEW

[t knee ap left]
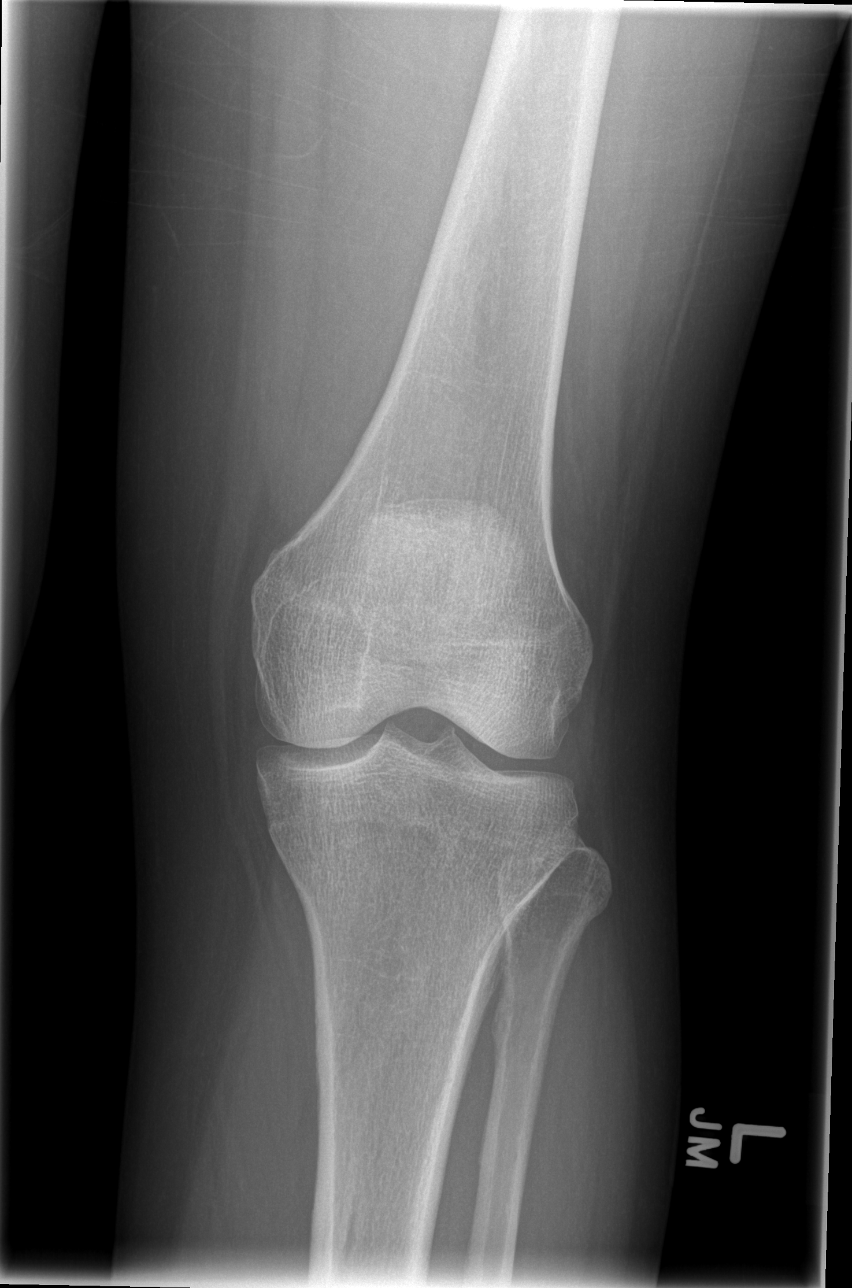

[t knee oblique left (1 of 2)]
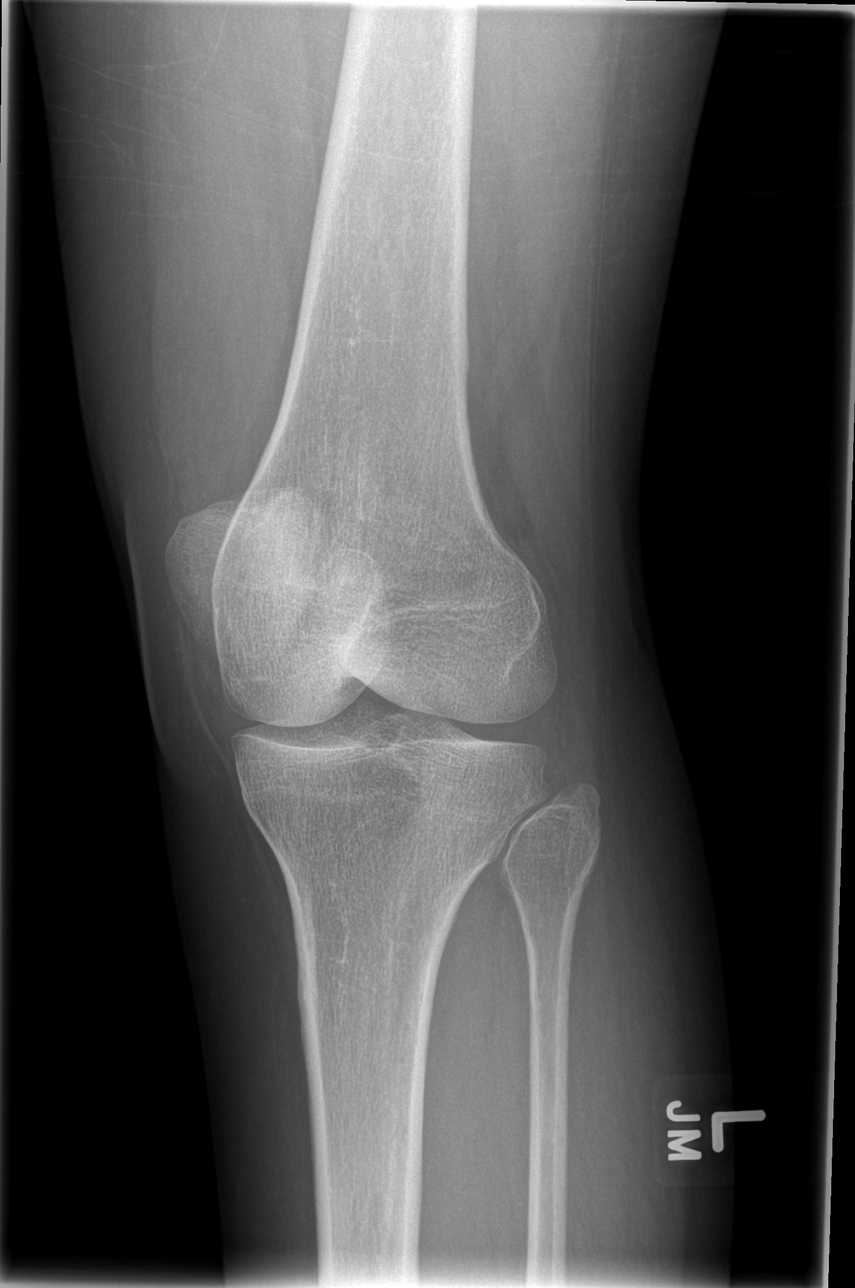

[t knee oblique left (2 of 2)]
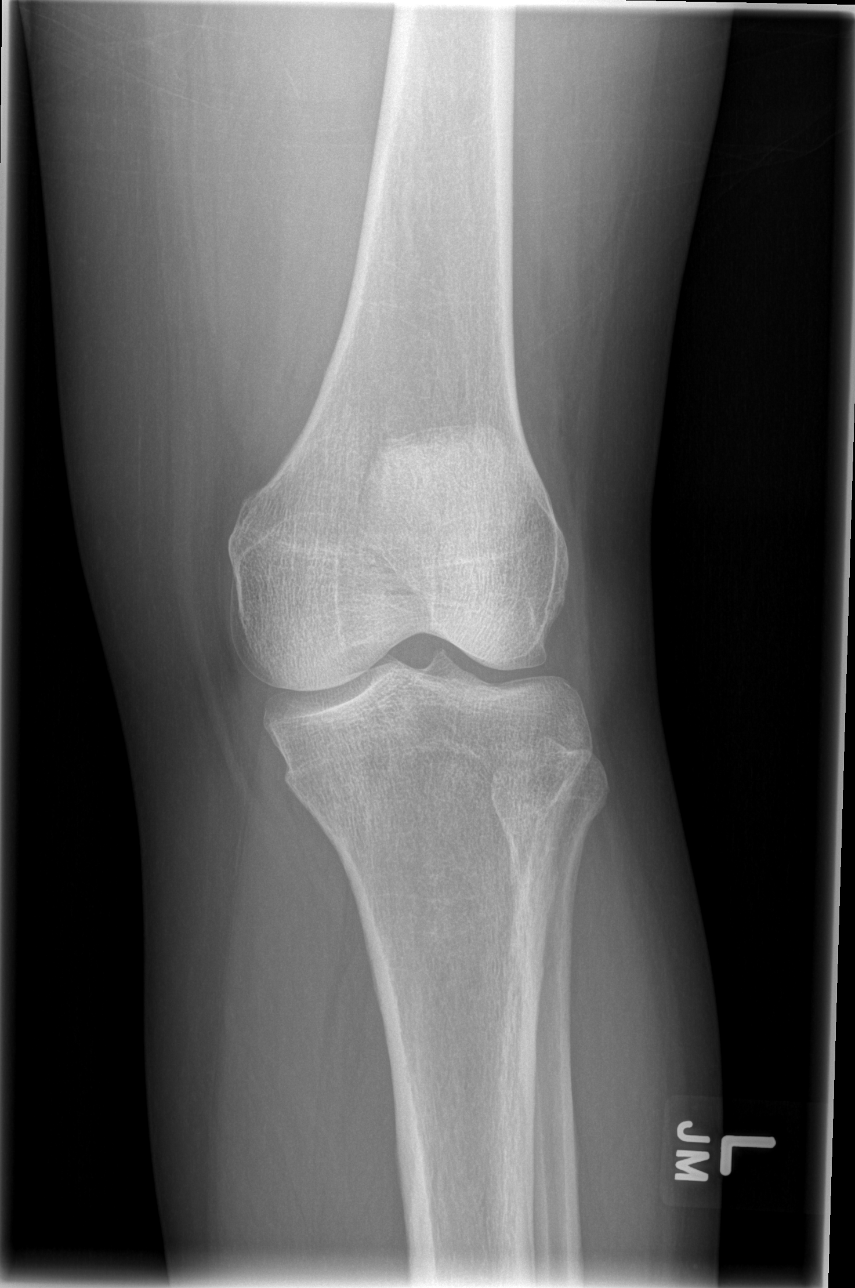

[t knee lat left]
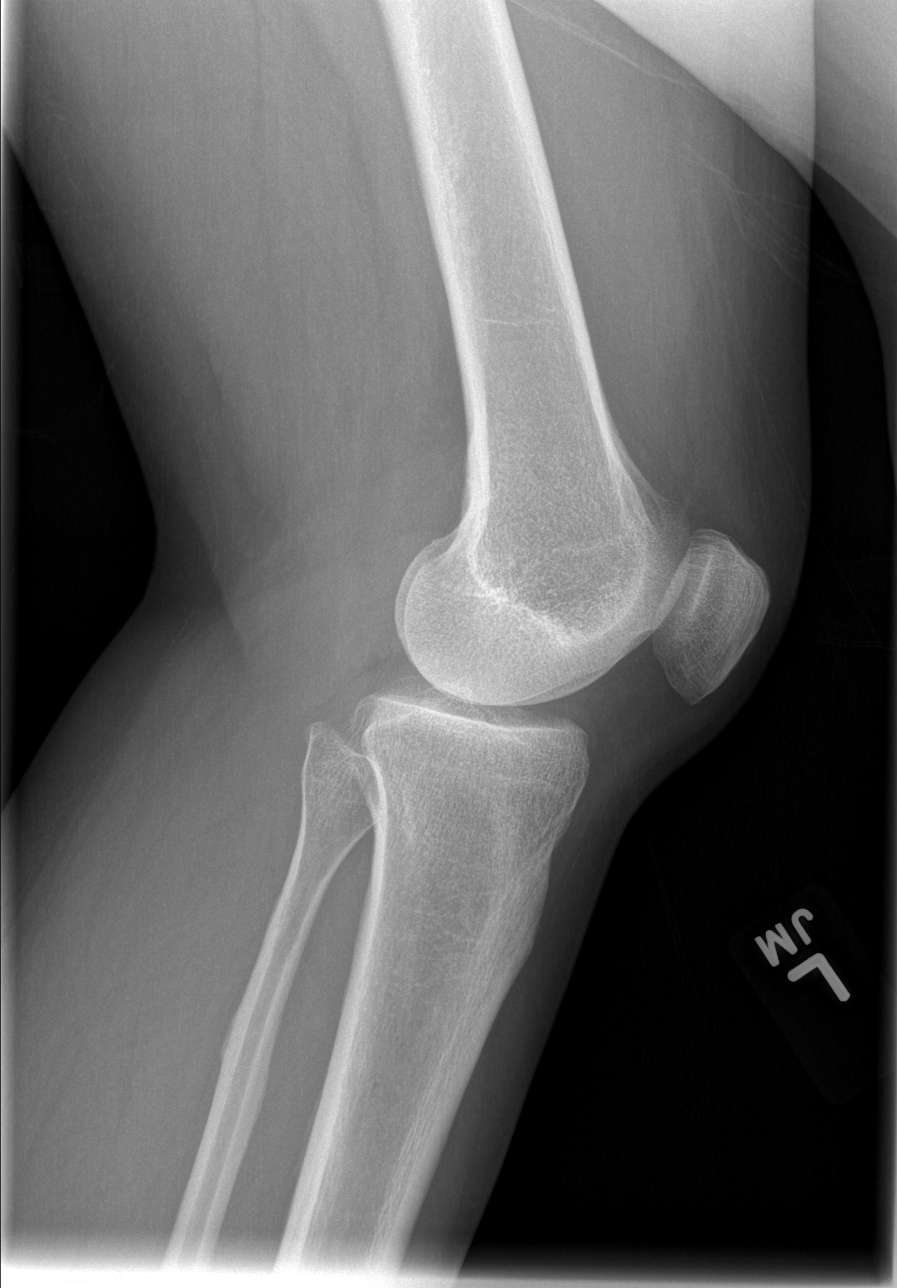

[4 of 4 positions shown; findings below may reference images not displayed]

FINDINGS: No evidence of fracture, dislocation, or joint effusion. No evidence
of arthropathy or other focal bone abnormality. Soft tissues are
unremarkable.
IMPRESSION: Negative exam.

## 2021-08-17 ENCOUNTER — Other Ambulatory Visit (HOSPITAL_COMMUNITY): Payer: Self-pay | Admitting: Neurological Surgery

## 2021-08-17 ENCOUNTER — Other Ambulatory Visit: Payer: Self-pay | Admitting: Neurological Surgery

## 2021-08-17 DIAGNOSIS — M5412 Radiculopathy, cervical region: Secondary | ICD-10-CM

## 2021-08-25 ENCOUNTER — Ambulatory Visit (HOSPITAL_COMMUNITY): Payer: Managed Care, Other (non HMO)

## 2021-09-01 ENCOUNTER — Ambulatory Visit (HOSPITAL_COMMUNITY)
Admission: RE | Admit: 2021-09-01 | Discharge: 2021-09-01 | Disposition: A | Discharge status: Home / Self Care | Payer: Self-pay | Source: Ambulatory Visit | Attending: Neurological Surgery | Admitting: Neurological Surgery

## 2021-09-01 DIAGNOSIS — M5412 Radiculopathy, cervical region: Secondary | ICD-10-CM | POA: Insufficient documentation
